# Patient Record
Sex: Male | Born: 1980 | Hispanic: Yes | Marital: Single | Smoking: Never smoker
Health system: Southern US, Community
[De-identification: ages and names within clinical notes are randomized; demographics above are authoritative.]

## PROBLEM LIST (undated history)

## (undated) DIAGNOSIS — I639 Cerebral infarction, unspecified: Secondary | ICD-10-CM

---

## 2016-03-29 DIAGNOSIS — Z0000 Encounter for general adult medical examination without abnormal findings: Secondary | ICD-10-CM | POA: Diagnosis not present

## 2016-03-29 DIAGNOSIS — Z6834 Body mass index (BMI) 34.0-34.9, adult: Secondary | ICD-10-CM | POA: Diagnosis not present

## 2016-08-24 DIAGNOSIS — M10071 Idiopathic gout, right ankle and foot: Secondary | ICD-10-CM | POA: Diagnosis not present

## 2016-10-05 DIAGNOSIS — R1084 Generalized abdominal pain: Secondary | ICD-10-CM | POA: Diagnosis not present

## 2016-10-05 DIAGNOSIS — R300 Dysuria: Secondary | ICD-10-CM | POA: Diagnosis not present

## 2016-10-05 DIAGNOSIS — R1030 Lower abdominal pain, unspecified: Secondary | ICD-10-CM | POA: Diagnosis not present

## 2016-10-05 DIAGNOSIS — R110 Nausea: Secondary | ICD-10-CM | POA: Diagnosis not present

## 2016-10-05 DIAGNOSIS — R109 Unspecified abdominal pain: Secondary | ICD-10-CM | POA: Diagnosis not present

## 2016-10-05 DIAGNOSIS — G8929 Other chronic pain: Secondary | ICD-10-CM | POA: Diagnosis not present

## 2016-10-05 DIAGNOSIS — R935 Abnormal findings on diagnostic imaging of other abdominal regions, including retroperitoneum: Secondary | ICD-10-CM | POA: Diagnosis not present

## 2016-12-15 DIAGNOSIS — R300 Dysuria: Secondary | ICD-10-CM | POA: Diagnosis not present

## 2016-12-15 DIAGNOSIS — R3911 Hesitancy of micturition: Secondary | ICD-10-CM | POA: Diagnosis not present

## 2016-12-15 DIAGNOSIS — Z7251 High risk heterosexual behavior: Secondary | ICD-10-CM | POA: Diagnosis not present

## 2017-05-24 ENCOUNTER — Encounter: Payer: Self-pay | Admitting: Family Medicine

## 2017-05-24 ENCOUNTER — Ambulatory Visit (INDEPENDENT_AMBULATORY_CARE_PROVIDER_SITE_OTHER): Payer: BLUE CROSS/BLUE SHIELD | Admitting: Family Medicine

## 2017-05-24 DIAGNOSIS — L03113 Cellulitis of right upper limb: Secondary | ICD-10-CM | POA: Diagnosis not present

## 2017-05-24 MED ORDER — CEPHALEXIN 500 MG PO CAPS: 500.0000 mg | ORAL_CAPSULE | Freq: Four times a day (QID) | ORAL | 0 refills | Status: DC

## 2017-05-24 MED ORDER — DICLOXACILLIN SODIUM 500 MG PO CAPS: 500.0000 mg | ORAL_CAPSULE | Freq: Four times a day (QID) | ORAL | 0 refills | Status: DC

## 2017-05-24 MED ORDER — HYDROCODONE-ACETAMINOPHEN 5-325 MG PO TABS: 1.0000 | ORAL_TABLET | Freq: Four times a day (QID) | ORAL | 0 refills | Status: DC | PRN

## 2017-05-24 NOTE — Patient Instructions (Addendum)
You have cellulitis. Take dicloxacillin four times a day. Norco as needed for severe pain (no driving, working on this medicine). Wear a sling for comfort. Follow up with me tomorrow afternoon in the [place] office to reevaluate.

## 2017-05-25 ENCOUNTER — Emergency Department (HOSPITAL_BASED_OUTPATIENT_CLINIC_OR_DEPARTMENT_OTHER)
Admission: EM | Admit: 2017-05-25 | Discharge: 2017-05-25 | Disposition: A | Discharge status: Home / Self Care | Payer: BLUE CROSS/BLUE SHIELD | Source: Home / Self Care | Attending: Emergency Medicine | Admitting: Emergency Medicine

## 2017-05-25 ENCOUNTER — Encounter (HOSPITAL_BASED_OUTPATIENT_CLINIC_OR_DEPARTMENT_OTHER): Payer: Self-pay | Admitting: Emergency Medicine

## 2017-05-25 ENCOUNTER — Encounter: Payer: Self-pay | Admitting: Family Medicine

## 2017-05-25 ENCOUNTER — Ambulatory Visit (INDEPENDENT_AMBULATORY_CARE_PROVIDER_SITE_OTHER): Payer: BLUE CROSS/BLUE SHIELD | Admitting: Family Medicine

## 2017-05-25 ENCOUNTER — Emergency Department (HOSPITAL_BASED_OUTPATIENT_CLINIC_OR_DEPARTMENT_OTHER): Payer: BLUE CROSS/BLUE SHIELD

## 2017-05-25 DIAGNOSIS — M7989 Other specified soft tissue disorders: Secondary | ICD-10-CM | POA: Diagnosis not present

## 2017-05-25 DIAGNOSIS — L03113 Cellulitis of right upper limb: Secondary | ICD-10-CM

## 2017-05-25 DIAGNOSIS — Z79899 Other long term (current) drug therapy: Secondary | ICD-10-CM

## 2017-05-25 DIAGNOSIS — Z23 Encounter for immunization: Secondary | ICD-10-CM | POA: Diagnosis not present

## 2017-05-25 DIAGNOSIS — S59901A Unspecified injury of right elbow, initial encounter: Secondary | ICD-10-CM | POA: Diagnosis not present

## 2017-05-25 IMAGING — DX DG ELBOW COMPLETE 3+V*R*
4 series · 4 of 4 positions shown · non-contrast
Comparison: None.

CLINICAL DATA: Acute onset of right elbow pain, swelling and
erythema.

EXAM:
RIGHT ELBOW - COMPLETE 3+ VIEW

[elbow ap]
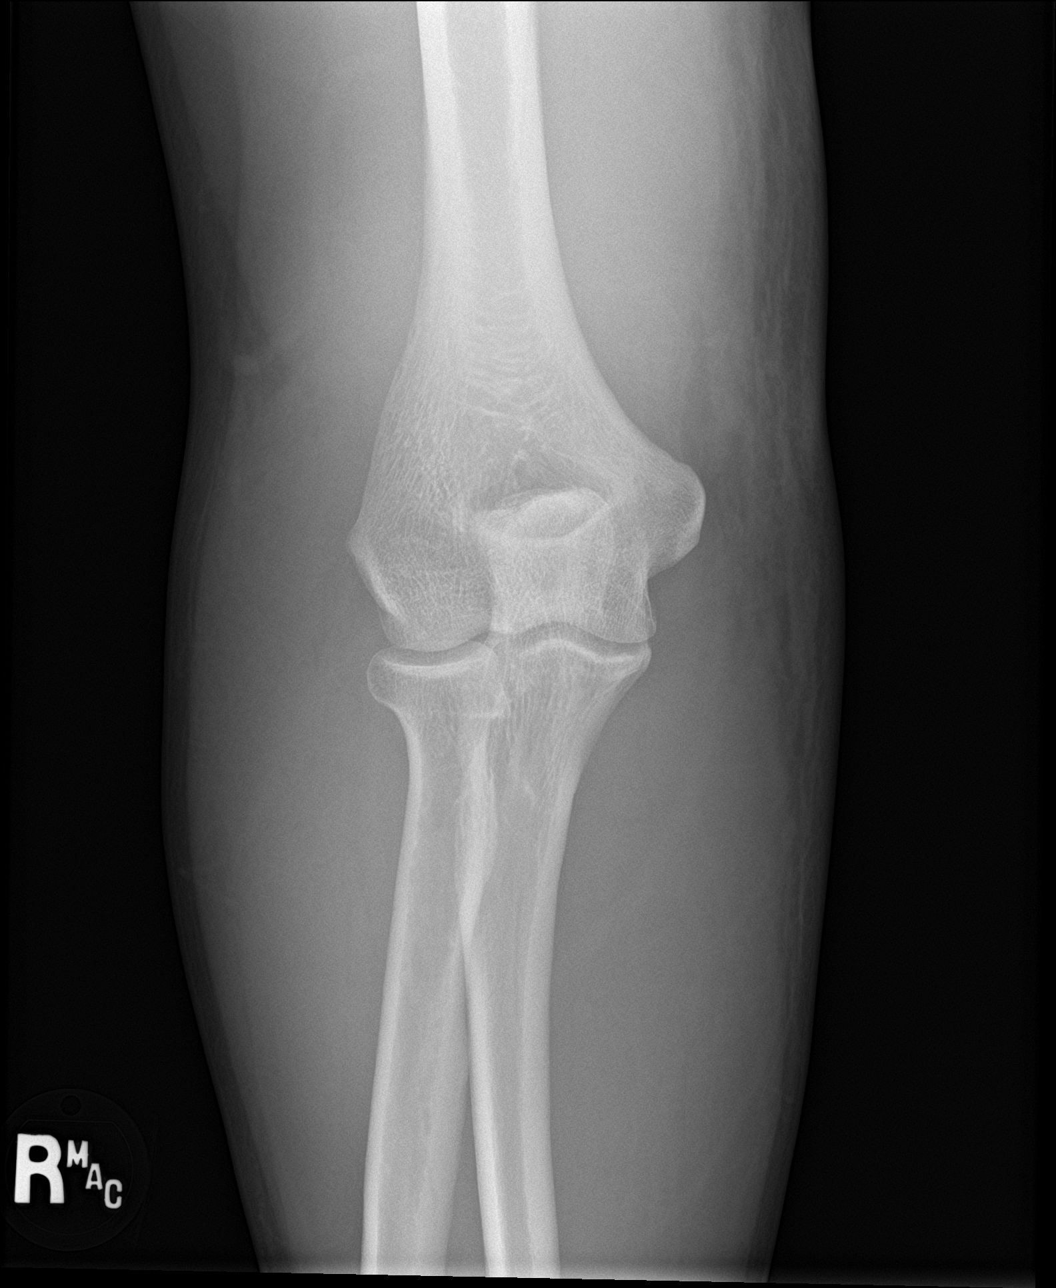

[elbow obl (1 of 2)]
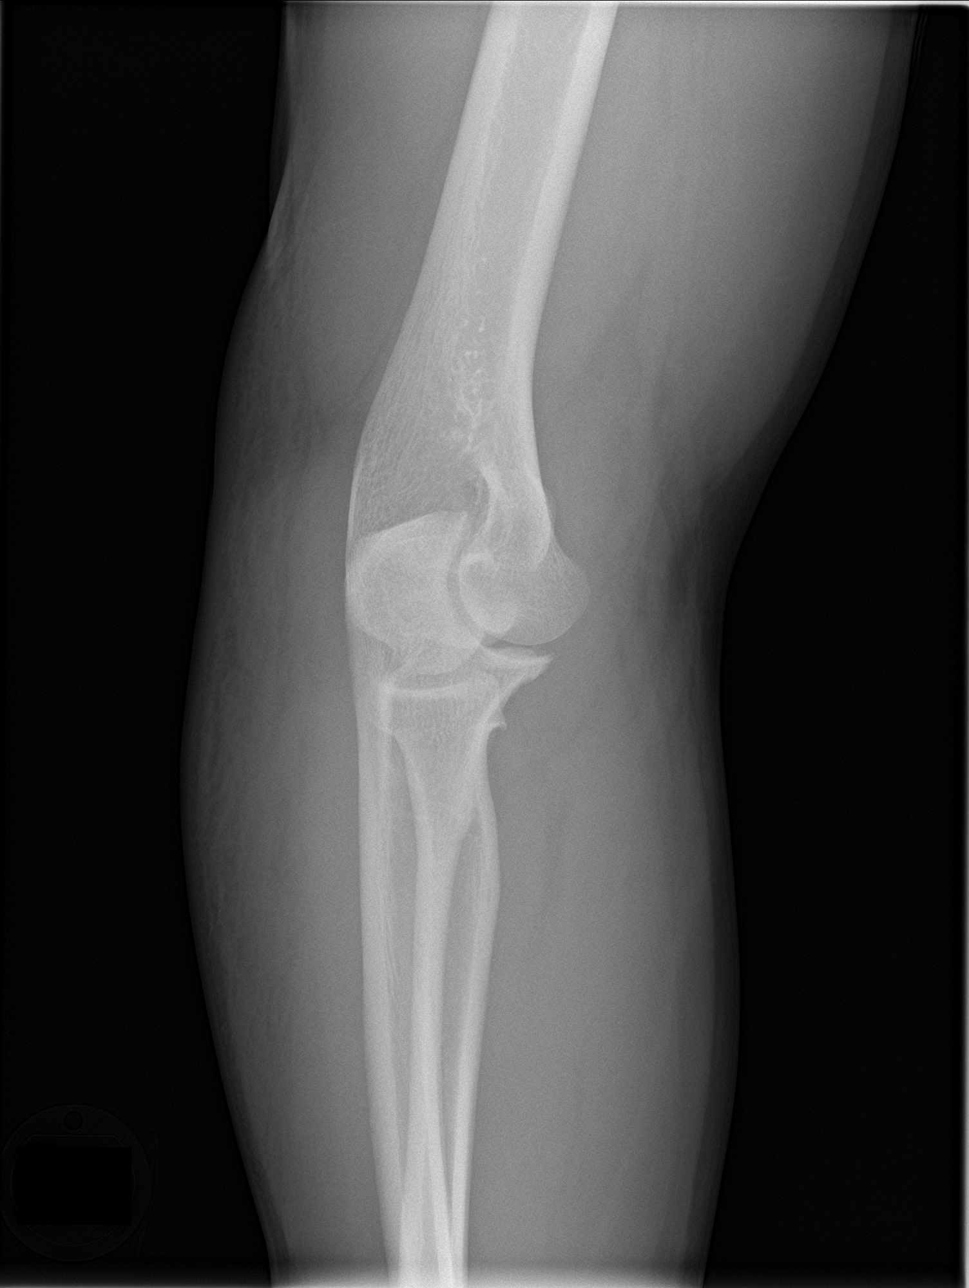

[elbow obl (2 of 2)]
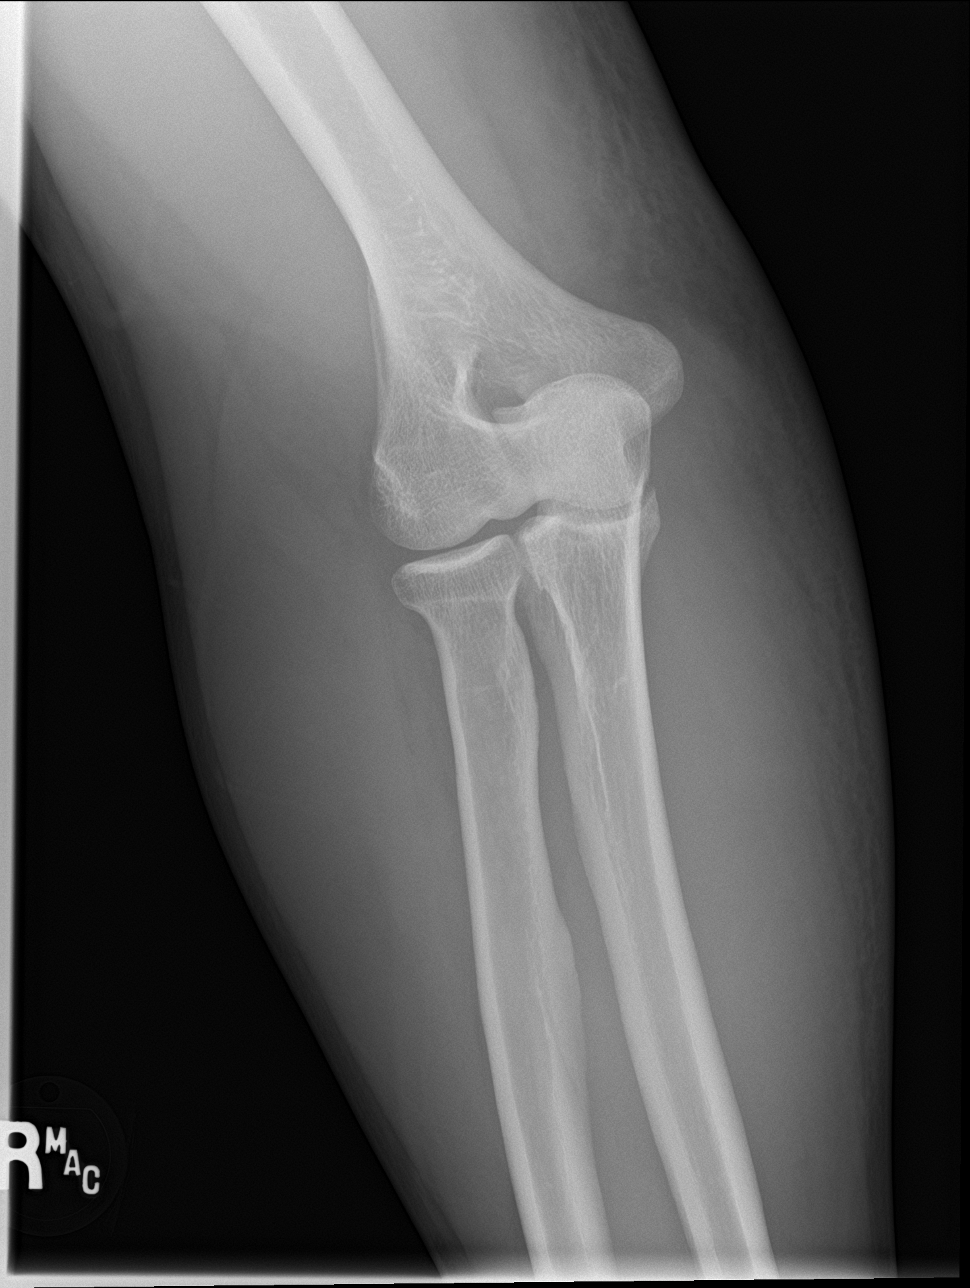

[elbow lat]
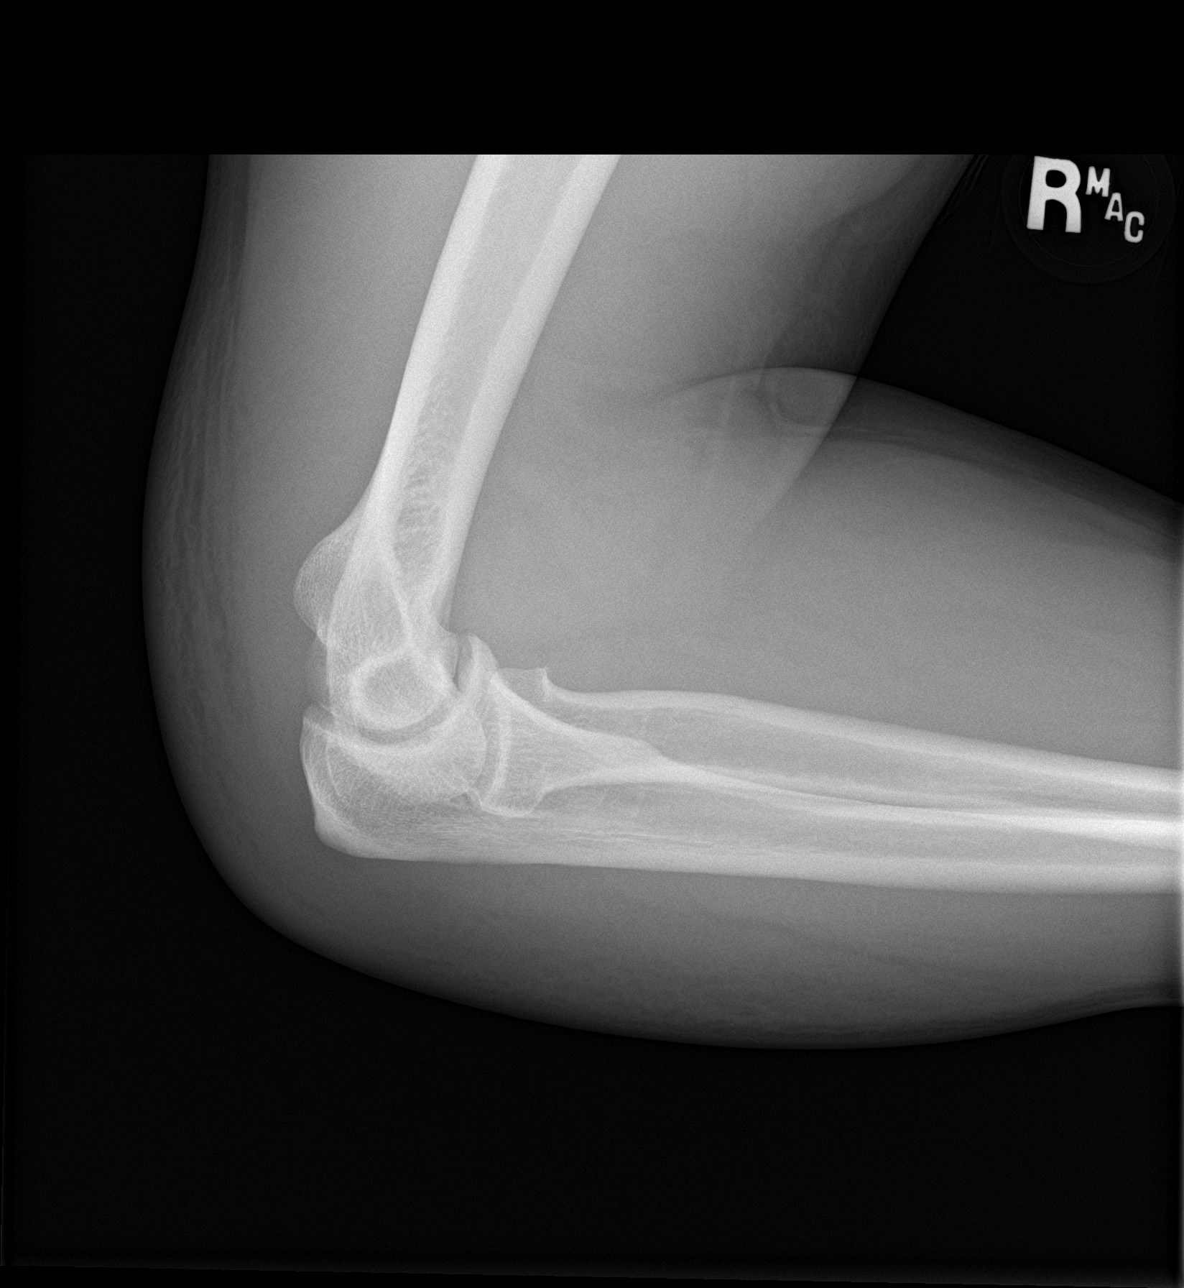

[4 of 4 positions shown; findings below may reference images not displayed]

FINDINGS: There is no evidence of fracture or dislocation. The visualized
joint spaces are preserved. No significant joint effusion is
identified. Diffuse soft tissue swelling is noted about the elbow.
No radiopaque foreign bodies are seen.
IMPRESSION: No evidence of fracture or dislocation. Diffuse soft tissue swelling
noted.

## 2017-05-25 MED ORDER — IBUPROFEN 400 MG PO TABS: 400.0000 mg | ORAL_TABLET | Freq: Once | ORAL | Status: DC

## 2017-05-25 MED ORDER — KETOROLAC TROMETHAMINE 60 MG/2ML IM SOLN
30.0000 mg | Freq: Once | INTRAMUSCULAR | Status: AC
  Administered 2017-05-25: 30 mg via INTRAMUSCULAR

## 2017-05-25 MED ORDER — DOXYCYCLINE HYCLATE 100 MG PO TABS
100.0000 mg | ORAL_TABLET | Freq: Once | ORAL | Status: AC
  Administered 2017-05-25: 100 mg via ORAL

## 2017-05-25 MED ORDER — DOXYCYCLINE HYCLATE 100 MG PO CAPS: 100.0000 mg | ORAL_CAPSULE | Freq: Two times a day (BID) | ORAL | 0 refills | Status: DC

## 2017-05-25 MED ORDER — KETOROLAC TROMETHAMINE 30 MG/ML IJ SOLN
INTRAMUSCULAR | Status: AC
  Filled 2017-05-25: qty 1

## 2017-05-25 MED ORDER — DOXYCYCLINE HYCLATE 100 MG PO TABS
ORAL_TABLET | ORAL | Status: AC
  Filled 2017-05-25: qty 1

## 2017-05-25 MED ORDER — IBUPROFEN 800 MG PO TABS: 800.0000 mg | ORAL_TABLET | Freq: Three times a day (TID) | ORAL | 0 refills | Status: DC

## 2017-05-25 NOTE — Assessment & Plan Note (Signed)
no evidence abscess or effusion.  Afebrile.  Will start keflex with norco as needed for severe pain.  Follow up in 24 hours for reevaluation.  Sling for comfort.

## 2017-05-25 NOTE — ED Provider Notes (Signed)
[facility] EMERGENCY DEPARTMENT Provider Note   CSN: [id number] Arrival date & time: 05/25/17  0449     History   Chief Complaint Chief Complaint  Patient presents with  . Arm Pain    HPI Steve Wolfe is a 37 y.o. male.  The history is provided by the patient.  Arm Pain  This is a new problem. The current episode started more than 1 week ago. The problem occurs constantly. The problem has been gradually worsening. Pertinent negatives include no chest pain, no abdominal pain, no headaches and no shortness of breath. Nothing aggravates the symptoms. Nothing relieves the symptoms. Treatments tried: keflex and norco. The treatment provided no relief.  Seen by Dr. Norton Blizzard yesterday and started on antibiotics and pain medication.  Had US done by Dr. Pearletha Forge without abscess per report.  Patient scheduled to see Dr. Pearletha Forge today for recheck but unable to tolerate pain on current regimen.  Hit it on the counter 3 weeks ago.  Tetanus is up to date.    History reviewed. No pertinent past medical history.  There are no active problems to display for this patient.   History reviewed. No pertinent surgical history.     Home Medications    Prior to Admission medications   Medication Sig Start Date End Date Taking? Authorizing Provider  cephALEXin (KEFLEX) 500 MG capsule Take 1 capsule (500 mg total) by mouth 4 (four) times daily. 05/24/17  Yes Hudnall, Azucena Fallen, MD  HYDROcodone-acetaminophen (NORCO) 5-325 MG tablet Take 1 tablet by mouth every 6 (six) hours as needed for moderate pain. 05/24/17  Yes Hudnall, Azucena Fallen, MD  ibuprofen (ADVIL,MOTRIN) 800 MG tablet Take 800 mg by mouth. 10/05/16  Yes [provider]    Family History No family history on file.  Social History Social History   Tobacco Use  . Smoking status: Never Smoker  . Smokeless tobacco: Never Used  Substance Use Topics  . Alcohol use: Not on file  . Drug use: Not on file     Allergies     Patient has no known allergies.   Review of Systems Review of Systems  Constitutional: Negative for appetite change and fever.  Respiratory: Negative for shortness of breath.   Cardiovascular: Negative for chest pain.  Gastrointestinal: Negative for abdominal pain and vomiting.  Musculoskeletal: Positive for arthralgias.  Neurological: Negative for headaches.  All other systems reviewed and are negative.    Physical Exam Updated Vital Signs BP 137/84 (BP Location: Left Arm)   Pulse 98   Temp 99.9 F (37.7 C) (Oral)   Resp 18   Ht 6' (1.829 m)   Wt 114.8 kg (253 lb)   SpO2 98%   BMI 34.31 kg/m   Physical Exam  Constitutional: He is oriented to person, place, and time. He appears well-developed and well-nourished. No distress.  HENT:  Head: Normocephalic and atraumatic.  Mouth/Throat: No oropharyngeal exudate.  Eyes: Conjunctivae are normal. Pupils are equal, round, and reactive to light.  Neck: Normal range of motion. Neck supple.  Cardiovascular: Normal rate, regular rhythm, normal heart sounds and intact distal pulses.  Pulmonary/Chest: Effort normal and breath sounds normal. No stridor. He has no wheezes. He has no rales.  Abdominal: Soft. Bowel sounds are normal. There is no tenderness. There is no rebound and no guarding.  Musculoskeletal: He exhibits edema.       Right elbow: He exhibits swelling. He exhibits no effusion, no deformity and no laceration. No tenderness found.  No radial head, no medial epicondyle, no lateral epicondyle and no olecranon process tenderness noted.       Arms: Neurological: He is alert and oriented to person, place, and time. He displays normal reflexes.  Skin: Skin is warm. Capillary refill takes less than 2 seconds.  Psychiatric: He has a normal mood and affect.     ED Treatments / Results   Radiology Dg Elbow Complete Right  Result Date: 05/25/2017 CLINICAL DATA:  Acute onset of right elbow pain, swelling and erythema. EXAM:  RIGHT ELBOW - COMPLETE 3+ VIEW COMPARISON:  None. FINDINGS: There is no evidence of fracture or dislocation. The visualized joint spaces are preserved. No significant joint effusion is identified. Diffuse soft tissue swelling is noted about the elbow. No radiopaque foreign bodies are seen. IMPRESSION: No evidence of fracture or dislocation. Diffuse soft tissue swelling noted. Electronically Signed   By: Roanna RaiderJeffery  Chang M.D.   On: 05/25/2017 05:47    Procedures Procedures (including critical care time)  Medications Ordered in ED Medications  ketorolac (TORADOL) injection 30 mg (30 mg Intramuscular Given 05/25/17 0518)  doxycycline (VIBRA-TABS) tablet 100 mg (100 mg Oral Given 05/25/17 0518)       Final Clinical Impressions(s) / ED Diagnoses   Follow up with Dr. Pearletha ForgeHudnall as scheduled.  Will add doxycycline and Ibuprofen.    Return for weakness, numbness, changes in vision or speech,  fevers > 100.4 unrelieved by medication, shortness of breath, intractable vomiting, or diarrhea, abdominal pain, Inability to tolerate liquids or food, cough, altered mental status or any concerns. No signs of systemic illness or infection. The patient is nontoxic-appearing on exam and vital signs are within normal limits.    I have reviewed the triage vital signs and the nursing notes. Pertinent labs &imaging results that were available during my care of the patient were reviewed by me and considered in my medical decision making (see chart for details).  After history, exam, and medical workup I feel the patient has been appropriately medically screened and is safe for discharge home. Pertinent diagnoses were discussed with the patient. Patient was given return precautions.   Jameya Pontiff, MD 05/25/17 [phone]

## 2017-05-25 NOTE — Assessment & Plan Note (Signed)
Patient feels better on the keflex and doxycycline now about 24 hours out since starting antibiotics.  Denies fever - was 99.9 in ED.  However, swelling is worse.  Marked outline with pen.  Advised if he feels worse, increased erythema/swelling to wrist or above mid-bicep, fever >100.4 despite abx, other concerns, to go to ED.  Continue keflex and doxy.  Norco if needed for pain.  Follow up tomorrow morning.  We discussed not unusual for appearance to be worse with antibiotic treatment but symptoms to be improved as bacteria causing cellulitis is treated.  Reports he no longer needs sling.

## 2017-05-25 NOTE — ED Notes (Signed)
Patient transported to X-ray 

## 2017-05-25 NOTE — Progress Notes (Signed)
PCP: Patient, No Pcp Per  Subjective:   HPI: Patient is a 37 y.o. male here for right elbow pain, swelling.  2/6: Patient reports about 3 weeks ago he recalls scraping his right elbow on the kitchen counter. He was sick around this time with some congestion, fever, had to take nyquil and improved. Elbow area crusted over. Then about 1 day ago he noticed right elbow was swelling, becoming more red, difficulty with extending this. Pain level 7/10 and sharp with extension of elbow posteriorly. No other skin changes, numbness.  2/7: Patient returns today for reevaluation of his elbow. He is taking his keflex without a problem. Went to ED overnight because of pain - was given shot of toradol, had radiographs that were normal except for soft tissue swelling (no effusion), doxycycline was added. He reports he feels much better though arm is more swollen. Pain level down to 5/10, can straighten elbow better. No other skin changes, numbness. Denies fevers.  History reviewed. No pertinent past medical history.  Current Outpatient Medications on File Prior to Visit  Medication Sig Dispense Refill  . cephALEXin (KEFLEX) 500 MG capsule Take 1 capsule (500 mg total) by mouth 4 (four) times daily. 28 capsule 0  . HYDROcodone-acetaminophen (NORCO) 5-325 MG tablet Take 1 tablet by mouth every 6 (six) hours as needed for moderate pain. 20 tablet 0  . ibuprofen (ADVIL,MOTRIN) 800 MG tablet Take 800 mg by mouth.     No current facility-administered medications on file prior to visit.     History reviewed. No pertinent surgical history.  No Known Allergies  Social History   Socioeconomic History  . Marital status: Single    Spouse name: Not on file  . Number of children: Not on file  . Years of education: Not on file  . Highest education level: Not on file  Social Needs  . Financial resource strain: Not on file  . Food insecurity - worry: Not on file  . Food insecurity - inability: Not  on file  . Transportation needs - medical: Not on file  . Transportation needs - non-medical: Not on file  Occupational History  . Not on file  Tobacco Use  . Smoking status: Never Smoker  . Smokeless tobacco: Never Used  Substance and Sexual Activity  . Alcohol use: Not on file  . Drug use: Not on file  . Sexual activity: Not on file  Other Topics Concern  . Not on file  Social History Narrative  . Not on file    History reviewed. No pertinent family history.  BP 117/83   Pulse (!) 112   Ht 6' (1.829 m)   Wt 253 lb (114.8 kg)   BMI 34.31 kg/m   Review of Systems: See HPI above.     Objective:  Physical Exam:  Gen: NAD, comfortable in exam room.  Right elbow: Worsened soft tissue swelling posterior elbow just over halfway into forearm.  Mild associated erythema that is more proximal in elbow.  No palpable mass.  No bruising or other deformity. Tenderness is minimal now over swollen area, improved. FROM with mild pain on extension.  5/5 strength flexion/extension. NVI distally.  MSK u/s:  Confirms still no presence of effusion or abscess.  No abnormalities of muscles deep to swollen area.  Radius and ulna, distal humerus appear normal.   Assessment & Plan:  1. Cellulitis of right elbow - Patient feels better on the keflex and doxycycline now about 24 hours out since starting  antibiotics.  Denies fever - was 99.9 in ED.  However, swelling is worse.  Marked outline with pen.  Advised if he feels worse, increased erythema/swelling to wrist or above mid-bicep, fever >100.4 despite abx, other concerns, to go to ED.  Continue keflex and doxy.  Norco if needed for pain.  Follow up tomorrow morning.  We discussed not unusual for appearance to be worse with antibiotic treatment but symptoms to be improved as bacteria causing cellulitis is treated.  Reports he no longer needs sling.

## 2017-05-25 NOTE — ED Triage Notes (Addendum)
Pain and swelling to right elbow. Scheduled to see Dr Marja KaysHudall today at 1600 . CMS intact to right hand

## 2017-05-25 NOTE — Progress Notes (Signed)
PCP: Patient, No Pcp Per  Subjective:   HPI: Patient is a 37 y.o. male here for right elbow pain, swelling.  Patient reports about 3 weeks ago he recalls scraping his right elbow on the kitchen counter. He was sick around this time with some congestion, fever, had to take nyquil and improved. Elbow area crusted over. Then about 1 day ago he noticed right elbow was swelling, becoming more red, difficulty with extending this. Pain level 7/10 and sharp with extension of elbow posteriorly. No other skin changes, numbness.  History reviewed. No pertinent past medical history.  Current Outpatient Medications on File Prior to Visit  Medication Sig Dispense Refill  . ibuprofen (ADVIL,MOTRIN) 800 MG tablet Take 800 mg by mouth.     No current facility-administered medications on file prior to visit.     History reviewed. No pertinent surgical history.  No Known Allergies  Social History   Socioeconomic History  . Marital status: Single    Spouse name: Not on file  . Number of children: Not on file  . Years of education: Not on file  . Highest education level: Not on file  Social Needs  . Financial resource strain: Not on file  . Food insecurity - worry: Not on file  . Food insecurity - inability: Not on file  . Transportation needs - medical: Not on file  . Transportation needs - non-medical: Not on file  Occupational History  . Not on file  Tobacco Use  . Smoking status: Never Smoker  . Smokeless tobacco: Never Used  Substance and Sexual Activity  . Alcohol use: Not on file  . Drug use: Not on file  . Sexual activity: Not on file  Other Topics Concern  . Not on file  Social History Narrative  . Not on file    History reviewed. No pertinent family history.  BP 127/86   Pulse 90   Temp 99.5 F (37.5 C) (Oral)   Ht 6' (1.829 m)   Wt 253 lb (114.8 kg)   BMI 34.31 kg/m   Review of Systems: See HPI above.     Objective:  Physical Exam:  Gen: NAD, comfortable  in exam room  Right elbow: Mod soft tissue swelling of posterior elbow into forearm with erythema.  No palpable mass.  No bruising, other deformity. TTP over swollen area.   FROM but pain on extension.  5/5 strength extension/flexion. Collateral ligaments intact. NVI distally.  MSK u/s:  No elbow effusion.  No abscess noted.  Significant soft tissue swelling however.   Assessment & Plan:  1. Cellulitis of right elbow - no evidence abscess or effusion.  Afebrile.  Will start keflex with norco as needed for severe pain.  Follow up in 24 hours for reevaluation.  Sling for comfort.

## 2017-05-26 ENCOUNTER — Ambulatory Visit (INDEPENDENT_AMBULATORY_CARE_PROVIDER_SITE_OTHER): Payer: BLUE CROSS/BLUE SHIELD | Admitting: Family Medicine

## 2017-05-26 ENCOUNTER — Encounter: Payer: Self-pay | Admitting: Family Medicine

## 2017-05-26 ENCOUNTER — Inpatient Hospital Stay (HOSPITAL_BASED_OUTPATIENT_CLINIC_OR_DEPARTMENT_OTHER)
Admission: EM | Admit: 2017-05-26 | Discharge: 2017-05-28 | DRG: 603 | Disposition: A | Discharge status: Home / Self Care | Payer: BLUE CROSS/BLUE SHIELD | Attending: Internal Medicine | Admitting: Internal Medicine

## 2017-05-26 ENCOUNTER — Other Ambulatory Visit: Payer: Self-pay

## 2017-05-26 ENCOUNTER — Encounter (HOSPITAL_BASED_OUTPATIENT_CLINIC_OR_DEPARTMENT_OTHER): Payer: Self-pay | Admitting: Emergency Medicine

## 2017-05-26 DIAGNOSIS — L03113 Cellulitis of right upper limb: Secondary | ICD-10-CM | POA: Diagnosis not present

## 2017-05-26 DIAGNOSIS — Z79899 Other long term (current) drug therapy: Secondary | ICD-10-CM | POA: Diagnosis not present

## 2017-05-26 DIAGNOSIS — Z23 Encounter for immunization: Secondary | ICD-10-CM | POA: Diagnosis not present

## 2017-05-26 DIAGNOSIS — L0390 Cellulitis, unspecified: Secondary | ICD-10-CM | POA: Diagnosis present

## 2017-05-26 LAB — URINALYSIS, ROUTINE W REFLEX MICROSCOPIC
Bilirubin Urine: NEGATIVE
GLUCOSE, UA: NEGATIVE mg/dL
Ketones, ur: NEGATIVE mg/dL
Leukocytes, UA: NEGATIVE
Nitrite: NEGATIVE
PROTEIN: NEGATIVE mg/dL
Specific Gravity, Urine: 1.02 (ref 1.005–1.030)
pH: 6 (ref 5.0–8.0)

## 2017-05-26 LAB — URINALYSIS, MICROSCOPIC (REFLEX): WBC, UA: NONE SEEN WBC/hpf (ref 0–5)

## 2017-05-26 LAB — COMPREHENSIVE METABOLIC PANEL
ALBUMIN: 3.8 g/dL (ref 3.5–5.0)
ALT: 39 U/L (ref 17–63)
AST: 34 U/L (ref 15–41)
Alkaline Phosphatase: 66 U/L (ref 38–126)
Anion gap: 8 (ref 5–15)
BUN: 16 mg/dL (ref 6–20)
CHLORIDE: 105 mmol/L (ref 101–111)
CO2: 24 mmol/L (ref 22–32)
CREATININE: 1.35 mg/dL — AB (ref 0.61–1.24)
Calcium: 8.8 mg/dL — ABNORMAL LOW (ref 8.9–10.3)
GFR calc Af Amer: >60 mL/min (ref >60)
GLUCOSE: 137 mg/dL — AB (ref 65–99)
Potassium: 3.9 mmol/L (ref 3.5–5.1)
SODIUM: 137 mmol/L (ref 135–145)
Total Bilirubin: 0.5 mg/dL (ref 0.3–1.2)
Total Protein: 7.5 g/dL (ref 6.5–8.1)

## 2017-05-26 LAB — CBC WITH DIFFERENTIAL/PLATELET
Basophils Absolute: 0 10*3/uL (ref 0.0–0.1)
Basophils Relative: 0 %
EOS ABS: 0.1 10*3/uL (ref 0.0–0.7)
EOS PCT: 1 %
HCT: 40 % (ref 39.0–52.0)
Hemoglobin: 13.6 g/dL (ref 13.0–17.0)
LYMPHS ABS: 1 10*3/uL (ref 0.7–4.0)
Lymphocytes Relative: 10 %
MCH: 29.9 pg (ref 26.0–34.0)
MCHC: 34 g/dL (ref 30.0–36.0)
MCV: 87.9 fL (ref 78.0–100.0)
MONO ABS: 0.6 10*3/uL (ref 0.1–1.0)
Monocytes Relative: 6 %
Neutro Abs: 7.9 10*3/uL — ABNORMAL HIGH (ref 1.7–7.7)
Neutrophils Relative %: 83 %
Platelets: 200 10*3/uL (ref 150–400)
RBC: 4.55 MIL/uL (ref 4.22–5.81)
RDW: 14.2 % (ref 11.5–15.5)
WBC: 9.7 10*3/uL (ref 4.0–10.5)

## 2017-05-26 LAB — I-STAT CG4 LACTIC ACID, ED: LACTIC ACID, VENOUS: 1.1 mmol/L (ref 0.5–1.9)

## 2017-05-26 MED ORDER — ACETAMINOPHEN 325 MG PO TABS
650.0000 mg | ORAL_TABLET | Freq: Once | ORAL | Status: AC | PRN
  Administered 2017-05-26: 650 mg via ORAL
  Filled 2017-05-26: qty 2

## 2017-05-26 MED ORDER — VANCOMYCIN HCL IN DEXTROSE 750-5 MG/150ML-% IV SOLN
750.0000 mg | Freq: Two times a day (BID) | INTRAVENOUS | Status: DC
  Administered 2017-05-27 – 2017-05-28 (×3): 750 mg via INTRAVENOUS
  Filled 2017-05-26 (×5): qty 150

## 2017-05-26 MED ORDER — VANCOMYCIN HCL 500 MG IV SOLR
INTRAVENOUS | Status: AC
  Filled 2017-05-26: qty 2000

## 2017-05-26 MED ORDER — VANCOMYCIN HCL 10 G IV SOLR
2000.0000 mg | Freq: Once | INTRAVENOUS | Status: AC
  Administered 2017-05-26: 2000 mg via INTRAVENOUS
  Filled 2017-05-26: qty 2000

## 2017-05-26 MED ORDER — SODIUM CHLORIDE 0.9 % IV BOLUS (SEPSIS)
1000.0000 mL | Freq: Once | INTRAVENOUS | Status: AC
  Administered 2017-05-26: 1000 mL via INTRAVENOUS

## 2017-05-26 NOTE — Patient Instructions (Signed)
Follow up with me next Tuesday for a no-charge visit. Finish the antibiotics even though you're improving. If you have any questions give me a call. If you develop a fever > 100.4, pain worsens, dizziness, go to the ER (this would be extremely unusual at this point).

## 2017-05-26 NOTE — ED Provider Notes (Signed)
[facility] EMERGENCY DEPARTMENT Provider Note   CSN: [id number] Arrival date & time: 05/26/17  2008     History   Chief Complaint Chief Complaint  Patient presents with  . Fever  . Cellulitis    HPI Steve Wolfe is a 37 y.o. male.  HPI Patient presents with fever and chills.  Over the last few days he has had a cellulitis of his left elbow and forearm.'s been seen both in the urgent care and twice at sports medicine, Dr. Pearletha ForgeHudnall.  Had been seen earlier today in the office and had been doing well.  States his swelling is gone down.  Now at around 4:00 today began to have fevers.  States he has had chills.  States he feels lightheaded.  States the redness that was previously demarcated by a pen mark has now extended past the borders.  No cough.  No shortness of breath.  No nausea vomiting diarrhea.  He had had an ultrasound done at sports medicine did not show an abscess.  Had been on Keflex and yesterday started on doxycycline. History reviewed. No pertinent past medical history.  Patient Active Problem List   Diagnosis Date Noted  . Cellulitis of right elbow 05/25/2017    History reviewed. No pertinent surgical history.     Home Medications    Prior to Admission medications   Medication Sig Start Date End Date Taking? Authorizing Provider  cephALEXin (KEFLEX) 500 MG capsule Take 1 capsule (500 mg total) by mouth 4 (four) times daily. 05/24/17   Hudnall, Azucena FallenShane R, MD  doxycycline (VIBRAMYCIN) 100 MG capsule Take 1 capsule (100 mg total) by mouth 2 (two) times daily. One po bid x 7 days 05/25/17   Nicanor AlconPalumbo, April, MD  HYDROcodone-acetaminophen (NORCO) 5-325 MG tablet Take 1 tablet by mouth every 6 (six) hours as needed for moderate pain. 05/24/17   Lenda KelpHudnall, Shane R, MD  ibuprofen (ADVIL,MOTRIN) 800 MG tablet Take 800 mg by mouth. 10/05/16   [provider]  ibuprofen (ADVIL,MOTRIN) 800 MG tablet Take 1 tablet (800 mg total) by mouth 3 (three) times daily. 05/25/17    Palumbo, April, MD    Family History History reviewed. No pertinent family history.  Social History Social History   Tobacco Use  . Smoking status: Never Smoker  . Smokeless tobacco: Never Used  Substance Use Topics  . Alcohol use: Not on file  . Drug use: Not on file     Allergies   Patient has no known allergies.   Review of Systems Review of Systems  Constitutional: Positive for fatigue and fever. Negative for appetite change.  HENT: Negative for congestion.   Respiratory: Negative for shortness of breath.   Cardiovascular: Negative for chest pain.  Gastrointestinal: Negative for abdominal pain.  Genitourinary: Negative for flank pain.  Musculoskeletal: Negative for back pain.  Skin: Positive for wound.  Neurological: Positive for light-headedness.  Psychiatric/Behavioral: Negative for confusion.     Physical Exam Updated Vital Signs BP (!) 146/83   Pulse (!) 114   Temp (!) 100.5 F (38.1 C) (Oral)   Resp 20   Ht 6' (1.829 m)   Wt 114.8 kg (253 lb)   SpO2 99%   BMI 34.31 kg/m   Physical Exam  Constitutional: He appears well-developed and well-nourished.  HENT:  Head: Atraumatic.  Eyes: Pupils are equal, round, and reactive to light.  Neck: Neck supple.  Cardiovascular:  Mild tachycardia  Pulmonary/Chest: Effort normal.  Abdominal: There is no tenderness.  Musculoskeletal:  Erythema with some swelling of right forearm and elbow.  Good range of motion elbow.  There is a pen demarcating previous erythema and the erythema has moved a few centimeters past this demarcation.  Skin: Skin is warm. Capillary refill takes less than 2 seconds.       ED Treatments / Results  Labs (all labs ordered are listed, but only abnormal results are displayed) Labs Reviewed  COMPREHENSIVE METABOLIC PANEL - Abnormal; Notable for the following components:      Result Value   Glucose, Bld 137 (*)    Creatinine, Ser 1.35 (*)    Calcium 8.8 (*)    All other  components within normal limits  CBC WITH DIFFERENTIAL/PLATELET - Abnormal; Notable for the following components:   Neutro Abs 7.9 (*)    All other components within normal limits  URINALYSIS, ROUTINE W REFLEX MICROSCOPIC - Abnormal; Notable for the following components:   Hgb urine dipstick MODERATE (*)    All other components within normal limits  URINALYSIS, MICROSCOPIC (REFLEX) - Abnormal; Notable for the following components:   Bacteria, UA RARE (*)    Squamous Epithelial / LPF 0-5 (*)    All other components within normal limits  I-STAT CG4 LACTIC ACID, ED  I-STAT CG4 LACTIC ACID, ED    EKG  EKG Interpretation None       Radiology Dg Elbow Complete Right  Result Date: 05/25/2017 CLINICAL DATA:  Acute onset of right elbow pain, swelling and erythema. EXAM: RIGHT ELBOW - COMPLETE 3+ VIEW COMPARISON:  None. FINDINGS: There is no evidence of fracture or dislocation. The visualized joint spaces are preserved. No significant joint effusion is identified. Diffuse soft tissue swelling is noted about the elbow. No radiopaque foreign bodies are seen. IMPRESSION: No evidence of fracture or dislocation. Diffuse soft tissue swelling noted. Electronically Signed   By: Roanna Raider M.D.   On: 05/25/2017 05:47    Procedures Procedures (including critical care time)  Medications Ordered in ED Medications  vancomycin (VANCOCIN) 2,000 mg in sodium chloride 0.9 % 500 mL IVPB (2,000 mg Intravenous New Bag/Given 05/26/17 2125)  vancomycin (VANCOCIN) 500 MG powder (not administered)  vancomycin (VANCOCIN) IVPB 750 mg/150 ml premix (not administered)  acetaminophen (TYLENOL) tablet 650 mg (650 mg Oral Given 05/26/17 2040)  sodium chloride 0.9 % bolus 1,000 mL (1,000 mLs Intravenous New Bag/Given 05/26/17 2131)     Initial Impression / Assessment and Plan / ED Course  I have reviewed the triage vital signs and the nursing notes.  Pertinent labs & imaging results that were available during my care  of the patient were reviewed by me and considered in my medical decision making (see chart for details).     Patient with forearm cellulitis.  Had been on Keflex and doxycycline but now began to have fevers.  Fevers and tachycardia.  Will admit to hospitalist.  Lab work overall reassuring with normal lactic acid.  Final Clinical Impressions(s) / ED Diagnoses   Final diagnoses:  Right forearm cellulitis    ED Discharge Orders    None       Benjiman Core, MD 05/26/17 2259

## 2017-05-26 NOTE — Progress Notes (Signed)
Pharmacy Antibiotic Note  Steve Wolfe is a 37 y.o. male admitted on 05/26/2017 with cellulitis.  Pharmacy has been consulted for vancomycin dosing.  SCr 1.35, normalized CrCl 4175ml/min  Plan: Give vancomycin 2g IV x 1, then start vancomycin 750mg  IV Q12h Monitor clinical picture, renal function, VT prn F/U C&S, abx deescalation / LOT   Height: 6' (182.9 cm) Weight: 253 lb (114.8 kg) IBW/kg (Calculated) : 77.6  Temp (24hrs), Avg:100.5 F (38.1 C), Min:100.5 F (38.1 C), Max:100.5 F (38.1 C)  No results for input(s): WBC, CREATININE, LATICACIDVEN, VANCOTROUGH, VANCOPEAK, VANCORANDOM, GENTTROUGH, GENTPEAK, GENTRANDOM, TOBRATROUGH, TOBRAPEAK, TOBRARND, AMIKACINPEAK, AMIKACINTROU, AMIKACIN in the last 168 hours.  CrCl cannot be calculated (No order found.).    No Known Allergies  Thank you for allowing pharmacy to be a part of this patient's care.  Steve StammerBATCHELDER,Steve Wolfe 05/26/2017 9:01 PM

## 2017-05-26 NOTE — Progress Notes (Signed)
37 yo male with right arm cellulitis and fever, failed outpatient therapy and ED requesting admission

## 2017-05-26 NOTE — ED Triage Notes (Addendum)
Patient reports fever since 1600 today.  Reports generalized body aches.  Seen yesterday for cellulitis of right arm.  Currently taking abx for this-states erythema spreading.

## 2017-05-26 NOTE — Progress Notes (Addendum)
PCP: Patient, No Pcp Per  Subjective:   HPI: Patient is a 37 y.o. male here for right elbow pain, swelling.  2/6: Patient reports about 3 weeks ago he recalls scraping his right elbow on the kitchen counter. He was sick around this time with some congestion, fever, had to take nyquil and improved. Elbow area crusted over. Then about 1 day ago he noticed right elbow was swelling, becoming more red, difficulty with extending this. Pain level 7/10 and sharp with extension of elbow posteriorly. No other skin changes, numbness.  2/7: Patient returns today for reevaluation of his elbow. He is taking his keflex without a problem. Went to ED overnight because of pain - was given shot of toradol, had radiographs that were normal except for soft tissue swelling (no effusion), doxycycline was added. He reports he feels much better though arm is more swollen. Pain level down to 5/10, can straighten elbow better. No other skin changes, numbness. Denies fevers.  2/8: Patient returns today reporting he feels better. Still with swelling, redness but no fevers. Pain level continues to come down to 2/10 level. Tolerating doxycycline and keflex without side effects, taking as directed. Taking ibuprofen. Able to extend elbow easier. No other skin changes, numbness.  History reviewed. No pertinent past medical history.  No current facility-administered medications on file prior to visit.    Current Outpatient Medications on File Prior to Visit  Medication Sig Dispense Refill  . cephALEXin (KEFLEX) 500 MG capsule Take 1 capsule (500 mg total) by mouth 4 (four) times daily. 28 capsule 0  . doxycycline (VIBRAMYCIN) 100 MG capsule Take 1 capsule (100 mg total) by mouth 2 (two) times daily. One po bid x 7 days 14 capsule 0  . HYDROcodone-acetaminophen (NORCO) 5-325 MG tablet Take 1 tablet by mouth every 6 (six) hours as needed for moderate pain. 20 tablet 0  . ibuprofen (ADVIL,MOTRIN) 800 MG tablet  Take 800 mg by mouth.    Marland Kitchen. ibuprofen (ADVIL,MOTRIN) 800 MG tablet Take 1 tablet (800 mg total) by mouth 3 (three) times daily. 21 tablet 0    History reviewed. No pertinent surgical history.  No Known Allergies  Social History   Socioeconomic History  . Marital status: Single    Spouse name: Not on file  . Number of children: Not on file  . Years of education: Not on file  . Highest education level: Not on file  Social Needs  . Financial resource strain: Not on file  . Food insecurity - worry: Not on file  . Food insecurity - inability: Not on file  . Transportation needs - medical: Not on file  . Transportation needs - non-medical: Not on file  Occupational History  . Not on file  Tobacco Use  . Smoking status: Never Smoker  . Smokeless tobacco: Never Used  Substance and Sexual Activity  . Alcohol use: Not on file  . Drug use: Not on file  . Sexual activity: Not on file  Other Topics Concern  . Not on file  Social History Narrative  . Not on file    History reviewed. No pertinent family history.  BP 127/87   Pulse 97   Ht 6' (1.829 m)   Wt 253 lb (114.8 kg)   BMI 34.31 kg/m   Review of Systems: See HPI above.     Objective:  Physical Exam:  Gen: NAD, comfortable in exam room.  Right elbow: Soft tissue swelling and redness appear similar to yesterday within confines  of pen marking from posterior elbow to distal forearm. Area of swelling is softer, less tender. No palpable masses to suggest abscess. FROM with mild pain on extension. NVI distally.   Assessment & Plan:  1. Cellulitis of right elbow - Patient reports he's doing well.  Vitals reassuring here without fever - yesterday was tachycardic but normocardic today.  Swelling/erythema also appears to have stabilized compared to yesterday on doxycycline and keflex.  Continue these until completion even if he feels better.  He will follow up with [place] on Monday to reevaluate.  Advised if he gets fever  >100.4, worsening pain, redness, dizziness despite antibiotics to seek care in ED over the weekend for IV antibiotics.  Addendum:  Noticed his temperature had not been entered in vital signs - added this - was 98.78F.

## 2017-05-26 NOTE — Assessment & Plan Note (Signed)
Patient reports he's doing well.  Vitals reassuring here without fever - yesterday was tachycardic but normocardic today.  Swelling/erythema also appears to have stabilized compared to yesterday on doxycycline and keflex.  Continue these until completion even if he feels better.  He will follow up with us on Monday to reevaluate.  Advised if he gets fever >100.4, worsening pain, redness, dizziness despite antibiotics to seek care in ED over the weekend for IV antibiotics.

## 2017-05-27 LAB — BASIC METABOLIC PANEL
ANION GAP: 7 (ref 5–15)
BUN: 14 mg/dL (ref 6–20)
CO2: 22 mmol/L (ref 22–32)
Calcium: 8.5 mg/dL — ABNORMAL LOW (ref 8.9–10.3)
Chloride: 108 mmol/L (ref 101–111)
Creatinine, Ser: 1.07 mg/dL (ref 0.61–1.24)
GLUCOSE: 113 mg/dL — AB (ref 65–99)
POTASSIUM: 3.8 mmol/L (ref 3.5–5.1)
Sodium: 137 mmol/L (ref 135–145)

## 2017-05-27 LAB — CBC
HEMATOCRIT: 37.7 % — AB (ref 39.0–52.0)
Hemoglobin: 12.9 g/dL — ABNORMAL LOW (ref 13.0–17.0)
MCH: 29.7 pg (ref 26.0–34.0)
MCHC: 34.2 g/dL (ref 30.0–36.0)
MCV: 86.9 fL (ref 78.0–100.0)
PLATELETS: 178 10*3/uL (ref 150–400)
RBC: 4.34 MIL/uL (ref 4.22–5.81)
RDW: 14.1 % (ref 11.5–15.5)
WBC: 8.3 10*3/uL (ref 4.0–10.5)

## 2017-05-27 LAB — HIV ANTIBODY (ROUTINE TESTING W REFLEX): HIV SCREEN 4TH GENERATION: NONREACTIVE

## 2017-05-27 MED ORDER — ACETAMINOPHEN 650 MG RE SUPP: 650.0000 mg | Freq: Four times a day (QID) | RECTAL | Status: DC | PRN

## 2017-05-27 MED ORDER — ONDANSETRON HCL 4 MG/2ML IJ SOLN: 4.0000 mg | Freq: Four times a day (QID) | INTRAMUSCULAR | Status: DC | PRN

## 2017-05-27 MED ORDER — ONDANSETRON HCL 4 MG PO TABS: 4.0000 mg | ORAL_TABLET | Freq: Four times a day (QID) | ORAL | Status: DC | PRN

## 2017-05-27 MED ORDER — IBUPROFEN 200 MG PO TABS
600.0000 mg | ORAL_TABLET | Freq: Three times a day (TID) | ORAL | Status: DC
  Administered 2017-05-27 – 2017-05-28 (×4): 600 mg via ORAL
  Filled 2017-05-27 (×4): qty 3

## 2017-05-27 MED ORDER — INFLUENZA VAC SPLIT QUAD 0.5 ML IM SUSY
0.5000 mL | PREFILLED_SYRINGE | INTRAMUSCULAR | Status: AC
  Administered 2017-05-28: 0.5 mL via INTRAMUSCULAR
  Filled 2017-05-27: qty 0.5

## 2017-05-27 MED ORDER — ENOXAPARIN SODIUM 60 MG/0.6ML ~~LOC~~ SOLN
60.0000 mg | Freq: Every day | SUBCUTANEOUS | Status: DC
  Administered 2017-05-27: 60 mg via SUBCUTANEOUS
  Filled 2017-05-27 (×2): qty 0.6

## 2017-05-27 MED ORDER — HYDROCODONE-ACETAMINOPHEN 5-325 MG PO TABS
1.0000 | ORAL_TABLET | Freq: Four times a day (QID) | ORAL | Status: DC | PRN
Start: 2017-05-27 — End: 2017-05-28
  Administered 2017-05-27 (×2): 1 via ORAL
  Filled 2017-05-27 (×2): qty 1

## 2017-05-27 MED ORDER — ENOXAPARIN SODIUM 40 MG/0.4ML ~~LOC~~ SOLN: 40.0000 mg | SUBCUTANEOUS | Status: DC

## 2017-05-27 MED ORDER — ACETAMINOPHEN 325 MG PO TABS: 650.0000 mg | ORAL_TABLET | Freq: Four times a day (QID) | ORAL | Status: DC | PRN

## 2017-05-27 NOTE — ED Notes (Signed)
Pt transported by [facility] at this time.

## 2017-05-27 NOTE — Progress Notes (Signed)
Patient ID: Steve Wolfe, male   DOB: 03/23/1981, 37 y.o.   MRN: [id number]  PROGRESS NOTE    Steve Wolfe  [id number] DOB: 02/15/1981 DOA: 05/26/2017 PCP: Patient, No Pcp Per   Outpatient Specialists: None   Brief Narrative:  A 37 year old male admitted with right arm cellulitis that has failed outpatient treatment.  Patient is on IV antibiotics.  His arm is swollen and tender.  Admitted for inpatient treatment for cellulitis probably as a result of  Abrasion.  Assessment & Plan:   Principal Problem:   Cellulitis of right elbow   #1 Cellulitis of the right elbow: Continue IV antibiotics. Culture and sensitivity is pending.  Elevate the arm.   DVT prophylaxis: SCD  Code Status: full  Family Communication: none  Disposition Plan: Home  Consultants:   None  Procedures:  None  Antimicrobials: Vancomycin  Subjective: Patient is complaining of pain.  His right arm, elbow and forearm is swollen tender right.  He is afebrile this morning  Objective: Vitals:   05/26/17 2330 05/27/17 0000 05/27/17 0028 05/27/17 1349  BP: 121/81 120/84 125/81 119/67  Pulse: 83 89 89 94  Resp: 20 18 18 18   Temp:    98.6 F (37 C)  TempSrc:    Oral  SpO2: 96% 95% 98% 98%  Weight:      Height:        Intake/Output Summary (Last 24 hours) at 05/27/2017 1805 Last data filed at 05/27/2017 1443 Gross per 24 hour  Intake 2130 ml  Output -  Net 2130 ml   Filed Weights   05/26/17 2018  Weight: 114.8 kg (253 lb)    Examination:  General exam: Appears calm and comfortable  Respiratory system: Clear to auscultation. Respiratory effort normal. Cardiovascular system: S1 & S2 heard, RRR. No JVD, murmurs, rubs, gallops or clicks. No pedal edema. Gastrointestinal system: Abdomen is nondistended, soft and nontender. No organomegaly or masses felt. Normal bowel sounds heard. Central nervous system: Alert and oriented. No focal neurological deficits. Extremities: Symmetric 5 x 5 power. Skin: No  rashes, lesions or ulcers Psychiatry: Judgement and insight appear normal. Mood & affect appropriate.     Data Reviewed: I have personally reviewed following labs and imaging studies  CBC: Recent Labs  Lab 05/26/17 2052 05/27/17 0147  WBC 9.7 8.3  NEUTROABS 7.9*  --   HGB 13.6 12.9*  HCT 40.0 37.7*  MCV 87.9 86.9  PLT 200 178   Basic Metabolic Panel: Recent Labs  Lab 05/26/17 2052 05/27/17 0147  NA 137 137  K 3.9 3.8  CL 105 108  CO2 24 22  GLUCOSE 137* 113*  BUN 16 14  CREATININE 1.35* 1.07  CALCIUM 8.8* 8.5*   GFR: Estimated Creatinine Clearance: 121.3 mL/min (by C-G formula based on SCr of 1.07 mg/dL). Liver Function Tests: Recent Labs  Lab 05/26/17 2052  AST 34  ALT 39  ALKPHOS 66  BILITOT 0.5  PROT 7.5  ALBUMIN 3.8   No results for input(s): LIPASE, AMYLASE in the last 168 hours. No results for input(s): AMMONIA in the last 168 hours. Coagulation Profile: No results for input(s): INR, PROTIME in the last 168 hours. Cardiac Enzymes: No results for input(s): CKTOTAL, CKMB, CKMBINDEX, TROPONINI in the last 168 hours. BNP (last 3 results) No results for input(s): PROBNP in the last 8760 hours. HbA1C: No results for input(s): HGBA1C in the last 72 hours. CBG: No results for input(s): GLUCAP in the last 168 hours. Lipid Profile: No results for  input(s): CHOL, HDL, LDLCALC, TRIG, CHOLHDL, LDLDIRECT in the last 72 hours. Thyroid Function Tests: No results for input(s): TSH, T4TOTAL, FREET4, T3FREE, THYROIDAB in the last 72 hours. Anemia Panel: No results for input(s): VITAMINB12, FOLATE, FERRITIN, TIBC, IRON, RETICCTPCT in the last 72 hours. Urine analysis:    Component Value Date/Time   COLORURINE YELLOW 05/26/2017 2052   APPEARANCEUR CLEAR 05/26/2017 2052   LABSPEC 1.020 05/26/2017 2052   PHURINE 6.0 05/26/2017 2052   GLUCOSEU NEGATIVE 05/26/2017 2052   HGBUR MODERATE (A) 05/26/2017 2052   BILIRUBINUR NEGATIVE 05/26/2017 2052   KETONESUR  NEGATIVE 05/26/2017 2052   PROTEINUR NEGATIVE 05/26/2017 2052   NITRITE NEGATIVE 05/26/2017 2052   LEUKOCYTESUR NEGATIVE 05/26/2017 2052   Sepsis Labs: @LABRCNTIP (procalcitonin:4,lacticidven:4)  )No results found for this or any previous visit (from the past 240 hour(s)).       Radiology Studies: No results found.      Scheduled Meds: . enoxaparin (LOVENOX) injection  60 mg Subcutaneous Daily  . ibuprofen  600 mg Oral TID  . [START ON 05/28/2017] Influenza vac split quadrivalent PF  0.5 mL Intramuscular Tomorrow-1000   Continuous Infusions: . vancomycin Stopped (05/27/17 1211)     LOS: 1 day    Time spent: 4    Junice Fei,LAWAL, MD [facility] Pager [phone]  If 7PM-7AM, please contact night-coverage [email or web address] Password [email or web address] 05/27/2017, 6:05 PM

## 2017-05-27 NOTE — Progress Notes (Signed)
Rx Brief note Lovenox Rx adjusted Lovenox to 60 mg (~ 0.5 mg/kg) daily in pt with BMI=34  Thanks Lorenza EvangelistGreen, Shadrack Brummitt R 05/27/2017. 2:18 AM

## 2017-05-27 NOTE — H&P (Signed)
History and Physical    [facility] UJW:[id number] DOB: 12/28/1980 DOA: 05/26/2017  PCP: Patient, No Pcp Per  Patient coming from: Home  I have personally briefly [facility]  Chief Complaint: Fever, cellulitis  HPI: Steve Wolfe is a 37 y.o. male with no significant PMH.  [facility] with c/o cellulitis of L elbow and forearm.  Seen 2x at [facility] by Dr. Pearletha ForgeHudnall since onset this past week.  Started on Keflex and doxy.  Swelling had gone down but at 4pm today began to have fevers, chills, worsening redness beyond site of prior demarcation.  Dr. Pearletha ForgeHudnall had checked it 2x in office with US but didn't see fluid collection on Wed nor Thurs.   ED Course: Tm 100.5.  Started on Vanc.   Review of Systems: As per HPI otherwise 10 point review of systems negative.   History reviewed. No pertinent past medical history.  History reviewed. No pertinent surgical history.   reports that  has never smoked. he has never used smokeless tobacco. His alcohol and drug histories are not on file.  No Known Allergies  History reviewed. No pertinent family history.   Prior to Admission medications   Medication Sig Start Date End Date Taking? Authorizing Provider  cephALEXin (KEFLEX) 500 MG capsule Take 1 capsule (500 mg total) by mouth 4 (four) times daily. 05/24/17   Wolfe, Steve FallenShane R, MD  doxycycline (VIBRAMYCIN) 100 MG capsule Take 1 capsule (100 mg total) by mouth 2 (two) times daily. One po bid x 7 days 05/25/17   Steve Wolfe, April, MD  HYDROcodone-acetaminophen (NORCO) 5-325 MG tablet Take 1 tablet by mouth every 6 (six) hours as needed for moderate pain. 05/24/17   Lenda KelpHudnall, Steve R, MD  ibuprofen (ADVIL,MOTRIN) 800 MG tablet Take 800 mg by mouth. 10/05/16   [provider]  ibuprofen (ADVIL,MOTRIN) 800 MG tablet Take 1 tablet (800 mg total) by mouth 3 (three) times daily. 05/25/17   Wolfe, April, MD    Physical Exam: Vitals:   05/26/17  2315 05/26/17 2330 05/27/17 0000 05/27/17 0028  BP: 126/79 121/81 120/84 125/81  Pulse: 84 83 89 89  Resp: 20 20 18 18   Temp: 98.8 F (37.1 C)     TempSrc: Oral     SpO2: 95% 96% 95% 98%  Weight:      Height:        Constitutional: NAD, calm, comfortable Eyes: PERRL, lids and conjunctivae normal ENMT: Mucous membranes are moist. Posterior pharynx clear of any exudate or lesions.Normal dentition.  Neck: normal, supple, no masses, no thyromegaly Respiratory: clear to auscultation bilaterally, no wheezing, no crackles. Normal respiratory effort. No accessory muscle use.  Cardiovascular: Regular rate and rhythm, no murmurs / rubs / gallops. No extremity edema. 2+ pedal pulses. No carotid bruits.  Abdomen: no tenderness, no masses palpated. No hepatosplenomegaly. Bowel sounds positive.  Musculoskeletal: no clubbing / cyanosis. No joint deformity upper and lower extremities. Good ROM, no contractures. Normal muscle tone.  Skin:    Neurologic: CN 2-12 grossly intact. Sensation intact, DTR normal. Strength 5/5 in all 4.  Psychiatric: Normal judgment and insight. Alert and oriented x 3. Normal mood.    Labs on Admission: I have personally reviewed following labs and imaging studies  CBC: Recent Labs  Lab 05/26/17 2052  WBC 9.7  NEUTROABS 7.9*  HGB 13.6  HCT 40.0  MCV 87.9  PLT 200   Basic Metabolic Panel: Recent Labs  Lab 05/26/17 2052  NA 137  K 3.9  CL 105  CO2 24  GLUCOSE 137*  BUN 16  CREATININE 1.35*  CALCIUM 8.8*   GFR: Estimated Creatinine Clearance: 96.1 mL/min (A) (by C-G formula based on SCr of 1.35 mg/dL (H)). Liver Function Tests: Recent Labs  Lab 05/26/17 2052  AST 34  ALT 39  ALKPHOS 66  BILITOT 0.5  PROT 7.5  ALBUMIN 3.8   No results for input(s): LIPASE, AMYLASE in the last 168 hours. No results for input(s): AMMONIA in the last 168 hours. Coagulation Profile: No results for input(s): INR, PROTIME in the last 168 hours. Cardiac Enzymes: No  results for input(s): CKTOTAL, CKMB, CKMBINDEX, TROPONINI in the last 168 hours. BNP (last 3 results) No results for input(s): PROBNP in the last 8760 hours. HbA1C: No results for input(s): HGBA1C in the last 72 hours. CBG: No results for input(s): GLUCAP in the last 168 hours. Lipid Profile: No results for input(s): CHOL, HDL, LDLCALC, TRIG, CHOLHDL, LDLDIRECT in the last 72 hours. Thyroid Function Tests: No results for input(s): TSH, T4TOTAL, FREET4, T3FREE, THYROIDAB in the last 72 hours. Anemia Panel: No results for input(s): VITAMINB12, FOLATE, FERRITIN, TIBC, IRON, RETICCTPCT in the last 72 hours. Urine analysis:    Component Value Date/Time   COLORURINE YELLOW 05/26/2017 2052   APPEARANCEUR CLEAR 05/26/2017 2052   LABSPEC 1.020 05/26/2017 2052   PHURINE 6.0 05/26/2017 2052   GLUCOSEU NEGATIVE 05/26/2017 2052   HGBUR MODERATE (A) 05/26/2017 2052   BILIRUBINUR NEGATIVE 05/26/2017 2052   KETONESUR NEGATIVE 05/26/2017 2052   PROTEINUR NEGATIVE 05/26/2017 2052   NITRITE NEGATIVE 05/26/2017 2052   LEUKOCYTESUR NEGATIVE 05/26/2017 2052    Radiological Exams on Admission: Dg Elbow Complete Right  Result Date: 05/25/2017 CLINICAL DATA:  Acute onset of right elbow pain, swelling and erythema. EXAM: RIGHT ELBOW - COMPLETE 3+ VIEW COMPARISON:  None. FINDINGS: There is no evidence of fracture or dislocation. The visualized joint spaces are preserved. No significant joint effusion is identified. Diffuse soft tissue swelling is noted about the elbow. No radiopaque foreign bodies are seen. IMPRESSION: No evidence of fracture or dislocation. Diffuse soft tissue swelling noted. Electronically Signed   By: Steve Wolfe M.D.   On: 05/25/2017 05:47    EKG: Independently reviewed.  Assessment/Plan Principal Problem:   Cellulitis of right elbow    1. Cellulitis of R elbow - DDx includes septic olecranon bursitis, good ROM at elbow so doubt septic arthritis. 1. Continue vanc (failed  outpatient keflex and doxy) 2. Repeat labs in AM 3. May want to have ortho surg eyeball this in the AM to make call on if its septic bursitis / need aspiration or surgical clean out.  DVT prophylaxis: Lovenox Code Status: Full Family Communication: No family in room Disposition Plan: Home after admit Consults called: None Admission status: Admit to inpatient - IP status as failed appropriate outpatient treatment.   Hillary Bow DO [facility] Pager [phone]  If 7AM-7PM, please contact day team taking care of patient [email or web address] Password [email or web address]  05/27/2017, 1:32 AM

## 2017-05-28 DIAGNOSIS — L03113 Cellulitis of right upper limb: Principal | ICD-10-CM

## 2017-05-28 LAB — CREATININE, SERUM
Creatinine, Ser: 1.18 mg/dL (ref 0.61–1.24)
GFR calc Af Amer: >60 mL/min (ref >60)
GFR calc non Af Amer: >60 mL/min (ref >60)

## 2017-05-28 LAB — CBC WITH DIFFERENTIAL/PLATELET
Basophils Absolute: 0 10*3/uL (ref 0.0–0.1)
Basophils Relative: 0 %
EOS ABS: 0.3 10*3/uL (ref 0.0–0.7)
Eosinophils Relative: 3 %
HCT: 38.5 % — ABNORMAL LOW (ref 39.0–52.0)
HEMOGLOBIN: 13.2 g/dL (ref 13.0–17.0)
LYMPHS ABS: 1.7 10*3/uL (ref 0.7–4.0)
Lymphocytes Relative: 18 %
MCH: 29.8 pg (ref 26.0–34.0)
MCHC: 34.3 g/dL (ref 30.0–36.0)
MCV: 86.9 fL (ref 78.0–100.0)
Monocytes Absolute: 0.7 10*3/uL (ref 0.1–1.0)
Monocytes Relative: 7 %
NEUTROS ABS: 6.6 10*3/uL (ref 1.7–7.7)
NEUTROS PCT: 72 %
Platelets: 236 10*3/uL (ref 150–400)
RBC: 4.43 MIL/uL (ref 4.22–5.81)
RDW: 14.1 % (ref 11.5–15.5)
WBC: 9.3 10*3/uL (ref 4.0–10.5)

## 2017-05-28 MED ORDER — SULFAMETHOXAZOLE-TRIMETHOPRIM 400-80 MG PO TABS: 1.0000 | ORAL_TABLET | Freq: Two times a day (BID) | ORAL | 0 refills | Status: DC

## 2017-05-28 MED ORDER — VANCOMYCIN HCL IN DEXTROSE 1-5 GM/200ML-% IV SOLN: 1000.0000 mg | Freq: Two times a day (BID) | INTRAVENOUS | Status: DC

## 2017-05-28 NOTE — Discharge Summary (Signed)
Physician Discharge Summary  [facility] ZOX:[id number] DOB: 10/11/1980 DOA: 05/26/2017  PCP: Patient, No Pcp Per  Admit date: 05/26/2017 Discharge date: 05/28/2017  Admitted From: Home Disposition: Home  Recommendations for Outpatient Follow-up:  1. Follow up with PCP in 1-2 weeks 2. Please obtain BMP/CBC in one week  Discharge Condition: Stable (Stable, guarded, hospice) CODE STATUS: Full (FULL, DNR, Comfort Care) Diet recommendation: Regular Brief/Interim Summary: Steve Wolfe is a 37 y.o. male with no significant PMH.  [facility] with c/o cellulitis of L elbow and forearm.  Seen 2x at UC by Dr. Pearletha ForgeHudnall since onset this past week.  Started on Keflex and doxy.  Swelling had gone down but at 4pm today began to have fevers, chills, worsening redness beyond site of prior demarcation.  Dr. Pearletha ForgeHudnall had checked it 2x in office with US but didn't see fluid collection on Wed nor Thurs.   Discharge Diagnoses:  Principal Problem:   Cellulitis of right elbow  ##) Cellulitis: Patient was admitted with failure of outpatient antibiotics with cellulitis of the right upper extremity.  Patient was given IV vancomycin with dramatic improvement in erythema pain and swelling.  Patient had return of function.  Patient's white blood cell count was never elevated.  Patient did not have any further fevers after being hospitalized.  Patient was told to discontinue oral doxycycline and transition to oral Bactrim for 7 days.  Patient was instructed to follow-up with his PCP in 1 week.  Strict return precautions given.  Discharge Instructions  Discharge Instructions    Call MD for:  redness, tenderness, or signs of infection (pain, swelling, redness, odor or green/yellow discharge around incision site)   Complete by:  As directed    Call MD for:  temperature >100.4   Complete by:  As directed    Diet - low sodium heart healthy   Complete by:  As directed    Discharge instructions   Complete  by:  As directed    Please find a primary care doctor to follow-up with.  Please stop taking doxycycline.  Please take Bactrim as prescribed for 7 days.   Increase activity slowly   Complete by:  As directed      Allergies as of 05/28/2017   No Known Allergies     Medication List    STOP taking these medications   doxycycline 100 MG capsule Commonly known as:  VIBRAMYCIN     TAKE these medications   cephALEXin 500 MG capsule Commonly known as:  KEFLEX Take 1 capsule (500 mg total) by mouth 4 (four) times daily.   HYDROcodone-acetaminophen 5-325 MG tablet Commonly known as:  NORCO Take 1 tablet by mouth every 6 (six) hours as needed for moderate pain.   ibuprofen 800 MG tablet Commonly known as:  ADVIL,MOTRIN Take 1 tablet (800 mg total) by mouth 3 (three) times daily. What changed:    when to take this  reasons to take this   sulfamethoxazole-trimethoprim 400-80 MG tablet Commonly known as:  BACTRIM Take 1 tablet by mouth 2 (two) times daily for 7 days.       No Known Allergies  Consultations:  None    Procedures/Studies: Dg Elbow Complete Right  Result Date: 05/25/2017 CLINICAL DATA:  Acute onset of right elbow pain, swelling and erythema. EXAM: RIGHT ELBOW - COMPLETE 3+ VIEW COMPARISON:  None. FINDINGS: There is no evidence of fracture or dislocation. The visualized joint spaces are preserved. No significant joint effusion is  identified. Diffuse soft tissue swelling is noted about the elbow. No radiopaque foreign bodies are seen. IMPRESSION: No evidence of fracture or dislocation. Diffuse soft tissue swelling noted. Electronically Signed   By: Roanna Raider M.D.   On: 05/25/2017 05:47       Subjective:   Discharge Exam: Vitals:   05/27/17 2038 05/28/17 0523  BP: 137/84 115/64  Pulse: 85 78  Resp: 17 16  Temp: 98.2 F (36.8 C) 98 F (36.7 C)  SpO2: 94% 96%   Vitals:   05/27/17 0028 05/27/17 1349 05/27/17 2038 05/28/17 0523  BP: 125/81 119/67  137/84 115/64  Pulse: 89 94 85 78  Resp: 18 18 17 16   Temp:  98.6 F (37 C) 98.2 F (36.8 C) 98 F (36.7 C)  TempSrc:  Oral Oral Oral  SpO2: 98% 98% 94% 96%  Weight:      Height:        General: Pt is alert, awake, not in acute distress Cardiovascular: RRR, S1/S2 +, no rubs, no gallops Respiratory: CTA bilaterally, no wheezing, no rhonchi Abdominal: Soft, NT, ND, bowel sounds + Extremities: Mild swelling and erythema over right upper extremity, no fluctuance, good pulses and good cap refill distally    The results of significant diagnostics from this hospitalization (including imaging, microbiology, ancillary and laboratory) are listed below for reference.     Microbiology: Recent Results (from the past 240 hour(s))  Culture, blood (routine x 2)     Status: None (Preliminary result)   Collection Time: 05/27/17  1:47 AM  Result Value Ref Range Status   Specimen Description   Final    BLOOD LEFT HAND Performed at [facility], [address] [place], [place] [address]    Special Requests   Final    BOTTLES DRAWN AEROBIC AND ANAEROBIC Blood Culture adequate volume Performed at [facility], [address] [place], [place] [address]    Culture   Final    NO GROWTH < 24 HOURS Performed at [facility], [address] [place], [place] [address]    Report Status PENDING  Incomplete  Culture, blood (routine x 2)     Status: None (Preliminary result)   Collection Time: 05/27/17  1:53 AM  Result Value Ref Range Status   Specimen Description   Final    BLOOD RIGHT HAND Performed at [facility], [address] [place], [place] [address]    Special Requests   Final    BOTTLES DRAWN AEROBIC AND ANAEROBIC Blood Culture adequate volume Performed at [facility], [address], [place], [place] [address]    Culture   Final    NO GROWTH < 24 HOURS Performed at [facility], [address] [place], [place] [address]    Report Status PENDING  Incomplete     Labs: BNP (last 3 results) No results for input(s): BNP in the last 8760 hours. Basic Metabolic Panel: Recent Labs  Lab 05/26/17 2052 05/27/17 0147 05/28/17 0542  NA 137 137  --   K 3.9 3.8  --   CL 105 108  --   CO2 24 22  --   GLUCOSE 137* 113*  --   BUN 16 14  --   CREATININE 1.35* 1.07 1.18  CALCIUM 8.8* 8.5*  --    Liver Function Tests: Recent Labs  Lab 05/26/17 2052  AST 34  ALT 39  ALKPHOS 66  BILITOT 0.5  PROT 7.5  ALBUMIN 3.8   No results for input(s): LIPASE, AMYLASE in the last 168 hours. No results for input(s): AMMONIA in the last 168 hours. CBC: Recent Labs  Lab 05/26/17 2052 05/27/17 0147 05/28/17 0542  WBC 9.7 8.3 9.3  NEUTROABS 7.9*  --  6.6  HGB 13.6 12.9* 13.2  HCT 40.0 37.7* 38.5*  MCV 87.9 86.9 86.9  PLT 200 178 236   Cardiac Enzymes: No results for input(s): CKTOTAL, CKMB, CKMBINDEX, TROPONINI in the last 168 hours. BNP: Invalid input(s): POCBNP CBG: No results for input(s): GLUCAP in the last 168 hours. D-Dimer No results for input(s): DDIMER in the last 72 hours. Hgb A1c No results for input(s): HGBA1C in the last 72 hours. Lipid Profile No results for input(s): CHOL, HDL, LDLCALC, TRIG, CHOLHDL, LDLDIRECT in the last 72 hours. Thyroid function studies No results for input(s): TSH, T4TOTAL, T3FREE, THYROIDAB in the last 72 hours.  Invalid input(s): FREET3 Anemia work up No results for input(s): VITAMINB12, FOLATE, FERRITIN, TIBC, IRON, RETICCTPCT in the last 72 hours. Urinalysis    Component Value Date/Time   COLORURINE YELLOW 05/26/2017 2052   APPEARANCEUR CLEAR 05/26/2017 2052   LABSPEC 1.020 05/26/2017 2052   PHURINE 6.0 05/26/2017 2052   GLUCOSEU NEGATIVE 05/26/2017 2052   HGBUR MODERATE (A) 05/26/2017 2052   BILIRUBINUR NEGATIVE 05/26/2017 2052   KETONESUR NEGATIVE 05/26/2017 2052   PROTEINUR NEGATIVE 05/26/2017 2052   NITRITE NEGATIVE  05/26/2017 2052   LEUKOCYTESUR NEGATIVE 05/26/2017 2052   Sepsis Labs Invalid input(s): PROCALCITONIN,  WBC,  LACTICIDVEN Microbiology Recent Results (from the past 240 hour(s))  Culture, blood (routine x 2)     Status: None (Preliminary result)   Collection Time: 05/27/17  1:47 AM  Result Value Ref Range Status   Specimen Description   Final    BLOOD LEFT HAND Performed at [facility], [address] [place], [place] [address]    Special Requests   Final    BOTTLES DRAWN AEROBIC AND ANAEROBIC Blood Culture adequate volume Performed at [facility] [address] [place], [place] [address]    Culture   Final    NO GROWTH < 24 HOURS Performed at [facility], [address] [place], [place] [address]    Report Status PENDING  Incomplete  Culture, blood (routine x 2)     Status: None (Preliminary result)   Collection Time: 05/27/17  1:53 AM  Result Value Ref Range Status   Specimen Description   Final    BLOOD RIGHT HAND Performed at [facility], [address], [place], [place] [address]    Special Requests   Final    BOTTLES DRAWN AEROBIC AND ANAEROBIC Blood Culture adequate volume Performed at [facility], [address], [place], [place] [address]    Culture   Final    NO GROWTH < 24 HOURS Performed at [facility], [address] [place], [place] [address]    Report Status PENDING  Incomplete     Time coordinating discharge: Over 30 minutes  SIGNED:   Delaine Lame, MD  [facility] 05/28/2017, 11:45 AM   If 7PM-7AM, please contact night-coverage [email or web address] Password [email or web address]

## 2017-05-28 NOTE — Progress Notes (Signed)
Discharge instructions and medications discussed with patient.  AVS & prescription given to patient.  All questions answered.  

## 2017-05-28 NOTE — Discharge Instructions (Signed)

## 2017-05-28 NOTE — Progress Notes (Signed)
Pharmacy Antibiotic Note  Steve Wolfe is a 37 y.o. male admitted on 05/26/2017 with cellulitis.  Pharmacy has been consulted for vancomcyin dosing.  Today, 05/28/2017: SCr 1.18 WBC remains WNL Afebrile  Plan:  Increase to Vancomycin 1000mg  IV q12h.  Measure Vanc peak, trough at steady state if continued  Follow up renal fxn, culture results, and clinical course.   Height: 6' (182.9 cm) Weight: 253 lb (114.8 kg) IBW/kg (Calculated) : 77.6  Temp (24hrs), Avg:98.3 F (36.8 C), Min:98 F (36.7 C), Max:98.6 F (37 C)  Recent Labs  Lab 05/26/17 2052 05/26/17 2101 05/27/17 0147 05/28/17 0542  WBC 9.7  --  8.3 9.3  CREATININE 1.35*  --  1.07  --   LATICACIDVEN  --  1.10  --   --     Estimated Creatinine Clearance: 121.3 mL/min (by C-G formula based on SCr of 1.07 mg/dL).    No Known Allergies  Antimicrobials this admission: 2/8 Vancomycin >>  Dose adjustments this admission:  Microbiology results: 2/9 BCx: ngtd  Thank you for allowing pharmacy to be a part of this patient's care.  Lynann Beaverhristine Amaris Delafuente PharmD, BCPS Pager [phone] 05/28/2017 10:24 AM

## 2017-05-29 ENCOUNTER — Ambulatory Visit: Payer: BLUE CROSS/BLUE SHIELD | Admitting: Family Medicine

## 2017-05-30 ENCOUNTER — Encounter: Payer: Self-pay | Admitting: Family Medicine

## 2017-05-30 ENCOUNTER — Ambulatory Visit (INDEPENDENT_AMBULATORY_CARE_PROVIDER_SITE_OTHER): Payer: BLUE CROSS/BLUE SHIELD | Admitting: Family Medicine

## 2017-05-30 DIAGNOSIS — L03113 Cellulitis of right upper limb: Secondary | ICD-10-CM

## 2017-05-30 MED ORDER — SULFAMETHOXAZOLE-TRIMETHOPRIM 800-160 MG PO TABS: 2.0000 | ORAL_TABLET | Freq: Two times a day (BID) | ORAL | 0 refills | Status: DC

## 2017-05-30 NOTE — Assessment & Plan Note (Addendum)
He is afebrile since his hospitalization - given IV vanc there and transitioned to bactrim.  Noted his dosage of bactrim is less than typical dosage for severe bacterial infections.  This may explain why there has not been much improvement since his hospital stay - advised to increase his bactrim to 4 tabs twice a day and when he runs out to fill Rx I sent in of Bactrim DS 2 tabs twice a day for 7 more days.  Finish keflex.  Again close follow up scheduled for Thursday and reviewed red flags to warrant ED visit (fever >100.4, worsening pain, redness spreading despite antibiotics, dizziness).  No evidence abscess, septic arthritis or bursitis. Believe fine blisters are due to the profound swelling he had, unlikely erysipelas given irregularly defined borders, clear cellulitis at beginning of presentation, and rash is not raised otherwise.

## 2017-05-30 NOTE — Progress Notes (Signed)
PCP: Patient, No Pcp Per  Subjective:   HPI: Patient is a 37 y.o. male here for right elbow pain, swelling.  2/6: Patient reports about 3 weeks ago he recalls scraping his right elbow on the kitchen counter. He was sick around this time with some congestion, fever, had to take nyquil and improved. Elbow area crusted over. Then about 1 day ago he noticed right elbow was swelling, becoming more red, difficulty with extending this. Pain level 7/10 and sharp with extension of elbow posteriorly. No other skin changes, numbness.  2/7: Patient returns today for reevaluation of his elbow. He is taking his keflex without a problem. Went to ED overnight because of pain - was given shot of toradol, had radiographs that were normal except for soft tissue swelling (no effusion), doxycycline was added. He reports he feels much better though arm is more swollen. Pain level down to 5/10, can straighten elbow better. No other skin changes, numbness. Denies fevers.  2/8: Patient returns today reporting he feels better. Still with swelling, redness but no fevers. Pain level continues to come down to 2/10 level. Tolerating doxycycline and keflex without side effects, taking as directed. Taking ibuprofen. Able to extend elbow easier. No other skin changes, numbness.  2/12: Patient returns for follow-up. He went to the ED the evening of 2/8 having spiked a temperature to 103 degrees with muscle aches, dizziness. He did well with IV vancomycin and was transitioned to oral bactrim, discharged on 2/10. Had no fevers during hospital stay and WBC count was normal. He reports still feeling well. States redness is about the same as it was past 2 days Some itchiness but pain is 0/10 He is taking bactrim and finishing his keflex. No fevers, chills, sweats, other complaints following his discharge.  History reviewed. No pertinent past medical history.  Current Outpatient Medications on File Prior to  Visit  Medication Sig Dispense Refill  . cephALEXin (KEFLEX) 500 MG capsule Take 1 capsule (500 mg total) by mouth 4 (four) times daily. 28 capsule 0  . HYDROcodone-acetaminophen (NORCO) 5-325 MG tablet Take 1 tablet by mouth every 6 (six) hours as needed for moderate pain. 20 tablet 0  . ibuprofen (ADVIL,MOTRIN) 800 MG tablet Take 1 tablet (800 mg total) by mouth 3 (three) times daily. (Patient taking differently: Take 800 mg by mouth every 8 (eight) hours as needed for fever, mild pain or moderate pain. ) 21 tablet 0   No current facility-administered medications on file prior to visit.     History reviewed. No pertinent surgical history.  No Known Allergies  Social History   Socioeconomic History  . Marital status: Single    Spouse name: Not on file  . Number of children: Not on file  . Years of education: Not on file  . Highest education level: Not on file  Social Needs  . Financial resource strain: Not on file  . Food insecurity - worry: Not on file  . Food insecurity - inability: Not on file  . Transportation needs - medical: Not on file  . Transportation needs - non-medical: Not on file  Occupational History  . Not on file  Tobacco Use  . Smoking status: Never Smoker  . Smokeless tobacco: Never Used  Substance and Sexual Activity  . Alcohol use: Not on file  . Drug use: Not on file  . Sexual activity: Not on file  Other Topics Concern  . Not on file  Social History Narrative  . Not on  file    History reviewed. No pertinent family history.  BP 138/85   Pulse 77   Temp 97.7 F (36.5 C) (Oral)   Ht 6' (1.829 m)   Wt 253 lb (114.8 kg)   BMI 34.31 kg/m   Review of Systems: See HPI above.     Objective:  Physical Exam:  Gen: NAD, comfortable in exam room.  Right elbow/forearm: Erythema from distal upper arm through to just proximal to his wrist.  There are very fine blisters proximal forearm.  Area throughout is mildly warm.  No palpable abscess or other  mass. FROM elbow and wrist with 5/5 strength and no pain. NVI distally.   Assessment & Plan:  1. Cellulitis of right elbow - He is afebrile since his hospitalization - given IV vanc there and transitioned to bactrim.  Noted his dosage of bactrim is less than typical dosage for severe bacterial infections.  This may explain why there has not been much improvement since his hospital stay - advised to increase his bactrim to 4 tabs twice a day and when he runs out to fill Rx I sent in of Bactrim DS 2 tabs twice a day for 7 more days.  Finish keflex.  Again close follow up scheduled for Thursday and reviewed red flags to warrant ED visit (fever >100.4, worsening pain, redness spreading despite antibiotics, dizziness).  No evidence abscess, septic arthritis or bursitis. Believe fine blisters are due to the profound swelling he had, unlikely erysipelas given irregularly defined borders, clear cellulitis at beginning of presentation, and rash is not raised otherwise.

## 2017-06-01 ENCOUNTER — Ambulatory Visit (INDEPENDENT_AMBULATORY_CARE_PROVIDER_SITE_OTHER): Payer: BLUE CROSS/BLUE SHIELD | Admitting: Family Medicine

## 2017-06-01 ENCOUNTER — Encounter: Payer: Self-pay | Admitting: Family Medicine

## 2017-06-01 DIAGNOSIS — L03113 Cellulitis of right upper limb: Secondary | ICD-10-CM

## 2017-06-01 LAB — CULTURE, BLOOD (ROUTINE X 2)
Culture: NO GROWTH
Culture: NO GROWTH
SPECIAL REQUESTS: ADEQUATE
Special Requests: ADEQUATE

## 2017-06-01 NOTE — Progress Notes (Signed)
PCP: Steve Wolfe, No Pcp Per  Subjective:   HPI: Steve Wolfe is a 37 y.o. male here for right elbow pain, swelling.  2/6: Steve Wolfe reports about 3 weeks ago he recalls scraping his right elbow on the kitchen counter. He was sick around this time with some congestion, fever, had to take nyquil and improved. Elbow area crusted over. Then about 1 day ago he noticed right elbow was swelling, becoming more red, difficulty with extending this. Pain level 7/10 and sharp with extension of elbow posteriorly. No other skin changes, numbness.  2/7: Steve Wolfe returns today for reevaluation of his elbow. He is taking his keflex without a problem. Went to ED overnight because of pain - was given shot of toradol, had radiographs that were normal except for soft tissue swelling (no effusion), doxycycline was added. He reports he feels much better though arm is more swollen. Pain level down to 5/10, can straighten elbow better. No other skin changes, numbness. Denies fevers.  2/8: Steve Wolfe returns today reporting he feels better. Still with swelling, redness but no fevers. Pain level continues to come down to 2/10 level. Tolerating doxycycline and keflex without side effects, taking as directed. Taking ibuprofen. Able to extend elbow easier. No other skin changes, numbness.  2/12: Steve Wolfe returns for follow-up. He went to the ED the evening of 2/8 having spiked a temperature to 103 degrees with muscle aches, dizziness. He did well with IV vancomycin and was transitioned to oral bactrim, discharged on 2/10. Had no fevers during hospital stay and WBC count was normal. He reports still feeling well. States redness is about the same as it was past 2 days Some itchiness but pain is 0/10 He is taking bactrim and finishing his keflex. No fevers, chills, sweats, other complaints following his discharge.  2/14: Steve Wolfe reports he's doing well. Some itching in area of rash, applying benadryl cream. Finished  keflex. Doing well with bactrim twice a day. No fevers, chills, sweats. Pain level 0/10.  History reviewed. No pertinent past medical history.  Current Outpatient Medications on File Prior to Visit  Medication Sig Dispense Refill  . cephALEXin (KEFLEX) 500 MG capsule Take 1 capsule (500 mg total) by mouth 4 (four) times daily. 28 capsule 0  . HYDROcodone-acetaminophen (NORCO) 5-325 MG tablet Take 1 tablet by mouth every 6 (six) hours as needed for moderate pain. 20 tablet 0  . ibuprofen (ADVIL,MOTRIN) 800 MG tablet Take 1 tablet (800 mg total) by mouth 3 (three) times daily. (Steve Wolfe taking differently: Take 800 mg by mouth every 8 (eight) hours as needed for fever, mild pain or moderate pain. ) 21 tablet 0  . sulfamethoxazole-trimethoprim (BACTRIM DS,SEPTRA DS) 800-160 MG tablet Take 2 tablets by mouth 2 (two) times daily. 28 tablet 0   No current facility-administered medications on file prior to visit.     History reviewed. No pertinent surgical history.  No Known Allergies  Social History   Socioeconomic History  . Marital status: Single    Spouse name: Not on file  . Number of children: Not on file  . Years of education: Not on file  . Highest education level: Not on file  Social Needs  . Financial resource strain: Not on file  . Food insecurity - worry: Not on file  . Food insecurity - inability: Not on file  . Transportation needs - medical: Not on file  . Transportation needs - non-medical: Not on file  Occupational History  . Not on file  Tobacco Use  .  Smoking status: Never Smoker  . Smokeless tobacco: Never Used  Substance and Sexual Activity  . Alcohol use: Not on file  . Drug use: Not on file  . Sexual activity: Not on file  Other Topics Concern  . Not on file  Social History Narrative  . Not on file    History reviewed. No pertinent family history.  BP 122/75   Pulse 74   Temp 98.1 F (36.7 C) (Oral)   Ht 6' (1.829 m)   Wt 253 lb (114.8 kg)    BMI 34.31 kg/m   Review of Systems: See HPI above.     Objective:  Physical Exam:  Gen: NAD, comfortable in exam room.  Right elbow/forearm: Interval improvement noted compared to visit 2 days ago - erythema at elbow down to 3/4th of way down forearm.  No longer with fine blisters.  Still with mild warmth over area.  No palpable abscess or mass.  1+ pitting edema proximal forearm. FROM elbow and wrist with 5/5 strength, no pain. NVI distally.  MSK u/s: no abscess noted, muscle involvement.  Course of radius and ulna appear normal.   Assessment & Plan:  1. Cellulitis of right elbow - Clinically improving.  S/p IV vanc.  Just finished keflex and taking bactrim ds 2 bid, tolerating well.  Advised to finish course of bactrim even if rash resolves.  Afebrile.  Will follow up on Monday.  Again reviewed red flags to warrant return trip to ED.  Tylenol if needed for soreness, topical benadryl.  Consider oral antihistamine.

## 2017-06-01 NOTE — Assessment & Plan Note (Signed)
Clinically improving.  S/p IV vanc.  Just finished keflex and taking bactrim ds 2 bid, tolerating well.  Advised to finish course of bactrim even if rash resolves.  Afebrile.  Will follow up on Monday.  Again reviewed red flags to warrant return trip to ED.  Tylenol if needed for soreness, topical benadryl.  Consider oral antihistamine.

## 2017-06-05 ENCOUNTER — Ambulatory Visit: Payer: BLUE CROSS/BLUE SHIELD | Admitting: Family Medicine

## 2017-06-08 ENCOUNTER — Ambulatory Visit (INDEPENDENT_AMBULATORY_CARE_PROVIDER_SITE_OTHER): Payer: BLUE CROSS/BLUE SHIELD | Admitting: Family Medicine

## 2017-06-08 ENCOUNTER — Encounter: Payer: Self-pay | Admitting: Family Medicine

## 2017-06-08 DIAGNOSIS — L03113 Cellulitis of right upper limb: Secondary | ICD-10-CM

## 2017-06-09 ENCOUNTER — Encounter: Payer: Self-pay | Admitting: Family Medicine

## 2017-06-09 NOTE — Progress Notes (Signed)
PCP: Patient, No Pcp Per  Subjective:   HPI: Patient is a 37 y.o. male here for right elbow pain, swelling.  2/6: Patient reports about 3 weeks ago he recalls scraping his right elbow on the kitchen counter. He was sick around this time with some congestion, fever, had to take nyquil and improved. Elbow area crusted over. Then about 1 day ago he noticed right elbow was swelling, becoming more red, difficulty with extending this. Pain level 7/10 and sharp with extension of elbow posteriorly. No other skin changes, numbness.  2/7: Patient returns today for reevaluation of his elbow. He is taking his keflex without a problem. Went to ED overnight because of pain - was given shot of toradol, had radiographs that were normal except for soft tissue swelling (no effusion), doxycycline was added. He reports he feels much better though arm is more swollen. Pain level down to 5/10, can straighten elbow better. No other skin changes, numbness. Denies fevers.  2/8: Patient returns today reporting he feels better. Still with swelling, redness but no fevers. Pain level continues to come down to 2/10 level. Tolerating doxycycline and keflex without side effects, taking as directed. Taking ibuprofen. Able to extend elbow easier. No other skin changes, numbness.  2/12: Patient returns for follow-up. He went to the ED the evening of 2/8 having spiked a temperature to 103 degrees with muscle aches, dizziness. He did well with IV vancomycin and was transitioned to oral bactrim, discharged on 2/10. Had no fevers during hospital stay and WBC count was normal. He reports still feeling well. States redness is about the same as it was past 2 days Some itchiness but pain is 0/10 He is taking bactrim and finishing his keflex. No fevers, chills, sweats, other complaints following his discharge.  2/14: Patient reports he's doing well. Some itching in area of rash, applying benadryl cream. Finished  keflex. Doing well with bactrim twice a day. No fevers, chills, sweats. Pain level 0/10.  2/21: Patient is doing well. No fevers, chills, sweats. Finished bactrim yesterday. Still with a little itchiness only but rash is gone as is the swelling.  History reviewed. No pertinent past medical history.  Current Outpatient Medications on File Prior to Visit  Medication Sig Dispense Refill  . cephALEXin (KEFLEX) 500 MG capsule Take 1 capsule (500 mg total) by mouth 4 (four) times daily. 28 capsule 0  . HYDROcodone-acetaminophen (NORCO) 5-325 MG tablet Take 1 tablet by mouth every 6 (six) hours as needed for moderate pain. 20 tablet 0  . ibuprofen (ADVIL,MOTRIN) 800 MG tablet Take 1 tablet (800 mg total) by mouth 3 (three) times daily. (Patient taking differently: Take 800 mg by mouth every 8 (eight) hours as needed for fever, mild pain or moderate pain. ) 21 tablet 0  . sulfamethoxazole-trimethoprim (BACTRIM DS,SEPTRA DS) 800-160 MG tablet Take 2 tablets by mouth 2 (two) times daily. 28 tablet 0   No current facility-administered medications on file prior to visit.     History reviewed. No pertinent surgical history.  No Known Allergies  Social History   Socioeconomic History  . Marital status: Single    Spouse name: Not on file  . Number of children: Not on file  . Years of education: Not on file  . Highest education level: Not on file  Social Needs  . Financial resource strain: Not on file  . Food insecurity - worry: Not on file  . Food insecurity - inability: Not on file  . Transportation needs -  medical: Not on file  . Transportation needs - non-medical: Not on file  Occupational History  . Not on file  Tobacco Use  . Smoking status: Never Smoker  . Smokeless tobacco: Never Used  Substance and Sexual Activity  . Alcohol use: Not on file  . Drug use: Not on file  . Sexual activity: Not on file  Other Topics Concern  . Not on file  Social History Narrative  . Not on  file    History reviewed. No pertinent family history.  BP 123/88   Pulse 80   Temp 97.7 F (36.5 C)   Ht 6' (1.829 m)   Wt 253 lb (114.8 kg)   BMI 34.31 kg/m   Review of Systems: See HPI above.     Objective:  Physical Exam:  Gen: NAD, comfortable in exam room.  Right elbow/forearm: Rash resolved with dryness, scaling of proximal forearm.  No swelling, bruising, other deformity.  No warmth. No TTP throughout forearm or elbow. FROM elbow and wrist with full strength and no pain. NVI distally.   Assessment & Plan:  1. Cellulitis of right elbow - Resolved with keflex and bactrim, s/p IV vanc in hospital.  Call us with any problems otherwise f/u prn.

## 2017-06-10 NOTE — Addendum Note (Signed)
Addended by: Lenda KelpHUDNALL, Serah Nicoletti R on: 06/10/2017 08:42 AM   Modules accepted: Level of Service

## 2017-06-10 NOTE — Assessment & Plan Note (Signed)
Resolved with keflex and bactrim, s/p IV vanc in hospital.  Call us with any problems otherwise f/u prn.

## 2017-08-23 DIAGNOSIS — S91332A Puncture wound without foreign body, left foot, initial encounter: Secondary | ICD-10-CM | POA: Diagnosis not present

## 2017-11-16 DIAGNOSIS — Z6832 Body mass index (BMI) 32.0-32.9, adult: Secondary | ICD-10-CM | POA: Diagnosis not present

## 2017-11-16 DIAGNOSIS — L739 Follicular disorder, unspecified: Secondary | ICD-10-CM | POA: Diagnosis not present

## 2017-11-25 DIAGNOSIS — Z114 Encounter for screening for human immunodeficiency virus [HIV]: Secondary | ICD-10-CM | POA: Diagnosis not present

## 2017-11-25 DIAGNOSIS — E291 Testicular hypofunction: Secondary | ICD-10-CM | POA: Diagnosis not present

## 2017-11-25 DIAGNOSIS — Z1331 Encounter for screening for depression: Secondary | ICD-10-CM | POA: Diagnosis not present

## 2017-11-25 DIAGNOSIS — Z0000 Encounter for general adult medical examination without abnormal findings: Secondary | ICD-10-CM | POA: Diagnosis not present

## 2017-11-25 DIAGNOSIS — Z7251 High risk heterosexual behavior: Secondary | ICD-10-CM | POA: Diagnosis not present

## 2017-12-04 DIAGNOSIS — B009 Herpesviral infection, unspecified: Secondary | ICD-10-CM | POA: Diagnosis not present

## 2017-12-04 DIAGNOSIS — E785 Hyperlipidemia, unspecified: Secondary | ICD-10-CM | POA: Diagnosis not present

## 2018-03-12 ENCOUNTER — Encounter (HOSPITAL_BASED_OUTPATIENT_CLINIC_OR_DEPARTMENT_OTHER): Payer: Self-pay | Admitting: Emergency Medicine

## 2018-03-12 ENCOUNTER — Other Ambulatory Visit: Payer: Self-pay

## 2018-03-12 DIAGNOSIS — E876 Hypokalemia: Secondary | ICD-10-CM | POA: Insufficient documentation

## 2018-03-12 DIAGNOSIS — R002 Palpitations: Secondary | ICD-10-CM | POA: Diagnosis not present

## 2018-03-12 DIAGNOSIS — Z79899 Other long term (current) drug therapy: Secondary | ICD-10-CM | POA: Diagnosis not present

## 2018-03-12 DIAGNOSIS — R7989 Other specified abnormal findings of blood chemistry: Secondary | ICD-10-CM | POA: Diagnosis not present

## 2018-03-12 NOTE — ED Triage Notes (Signed)
Pt states he is having heart palpitations off and on today   Denies chest pain

## 2018-03-13 ENCOUNTER — Emergency Department (HOSPITAL_BASED_OUTPATIENT_CLINIC_OR_DEPARTMENT_OTHER)
Admission: EM | Admit: 2018-03-13 | Discharge: 2018-03-13 | Disposition: A | Discharge status: Home / Self Care | Payer: BLUE CROSS/BLUE SHIELD | Attending: Emergency Medicine | Admitting: Emergency Medicine

## 2018-03-13 ENCOUNTER — Emergency Department (HOSPITAL_BASED_OUTPATIENT_CLINIC_OR_DEPARTMENT_OTHER): Payer: BLUE CROSS/BLUE SHIELD

## 2018-03-13 DIAGNOSIS — R7989 Other specified abnormal findings of blood chemistry: Secondary | ICD-10-CM

## 2018-03-13 DIAGNOSIS — R002 Palpitations: Secondary | ICD-10-CM

## 2018-03-13 DIAGNOSIS — E876 Hypokalemia: Secondary | ICD-10-CM

## 2018-03-13 LAB — BASIC METABOLIC PANEL
Anion gap: 9 (ref 5–15)
BUN: 26 mg/dL — ABNORMAL HIGH (ref 6–20)
CALCIUM: 9 mg/dL (ref 8.9–10.3)
CO2: 22 mmol/L (ref 22–32)
CREATININE: 1.62 mg/dL — AB (ref 0.61–1.24)
Chloride: 105 mmol/L (ref 98–111)
GFR calc Af Amer: >60 mL/min (ref >60)
GFR calc non Af Amer: 52 mL/min — ABNORMAL LOW (ref >60)
GLUCOSE: 114 mg/dL — AB (ref 70–99)
Potassium: 3.3 mmol/L — ABNORMAL LOW (ref 3.5–5.1)
Sodium: 136 mmol/L (ref 135–145)

## 2018-03-13 LAB — CBC
HEMATOCRIT: 44.9 % (ref 39.0–52.0)
Hemoglobin: 14.9 g/dL (ref 13.0–17.0)
MCH: 29.3 pg (ref 26.0–34.0)
MCHC: 33.2 g/dL (ref 30.0–36.0)
MCV: 88.2 fL (ref 80.0–100.0)
PLATELETS: 226 10*3/uL (ref 150–400)
RBC: 5.09 MIL/uL (ref 4.22–5.81)
RDW: 13.3 % (ref 11.5–15.5)
WBC: 7.8 10*3/uL (ref 4.0–10.5)
nRBC: 0 % (ref 0.0–0.2)

## 2018-03-13 LAB — TROPONIN I: Troponin I: <0.03 ng/mL (ref <0.03)

## 2018-03-13 IMAGING — DX DG CHEST 2V
2 series · 2 of 2 positions shown · non-contrast
Comparison: None.

CLINICAL DATA: Palpitations.

EXAM:
CHEST - 2 VIEW

[chest pa]
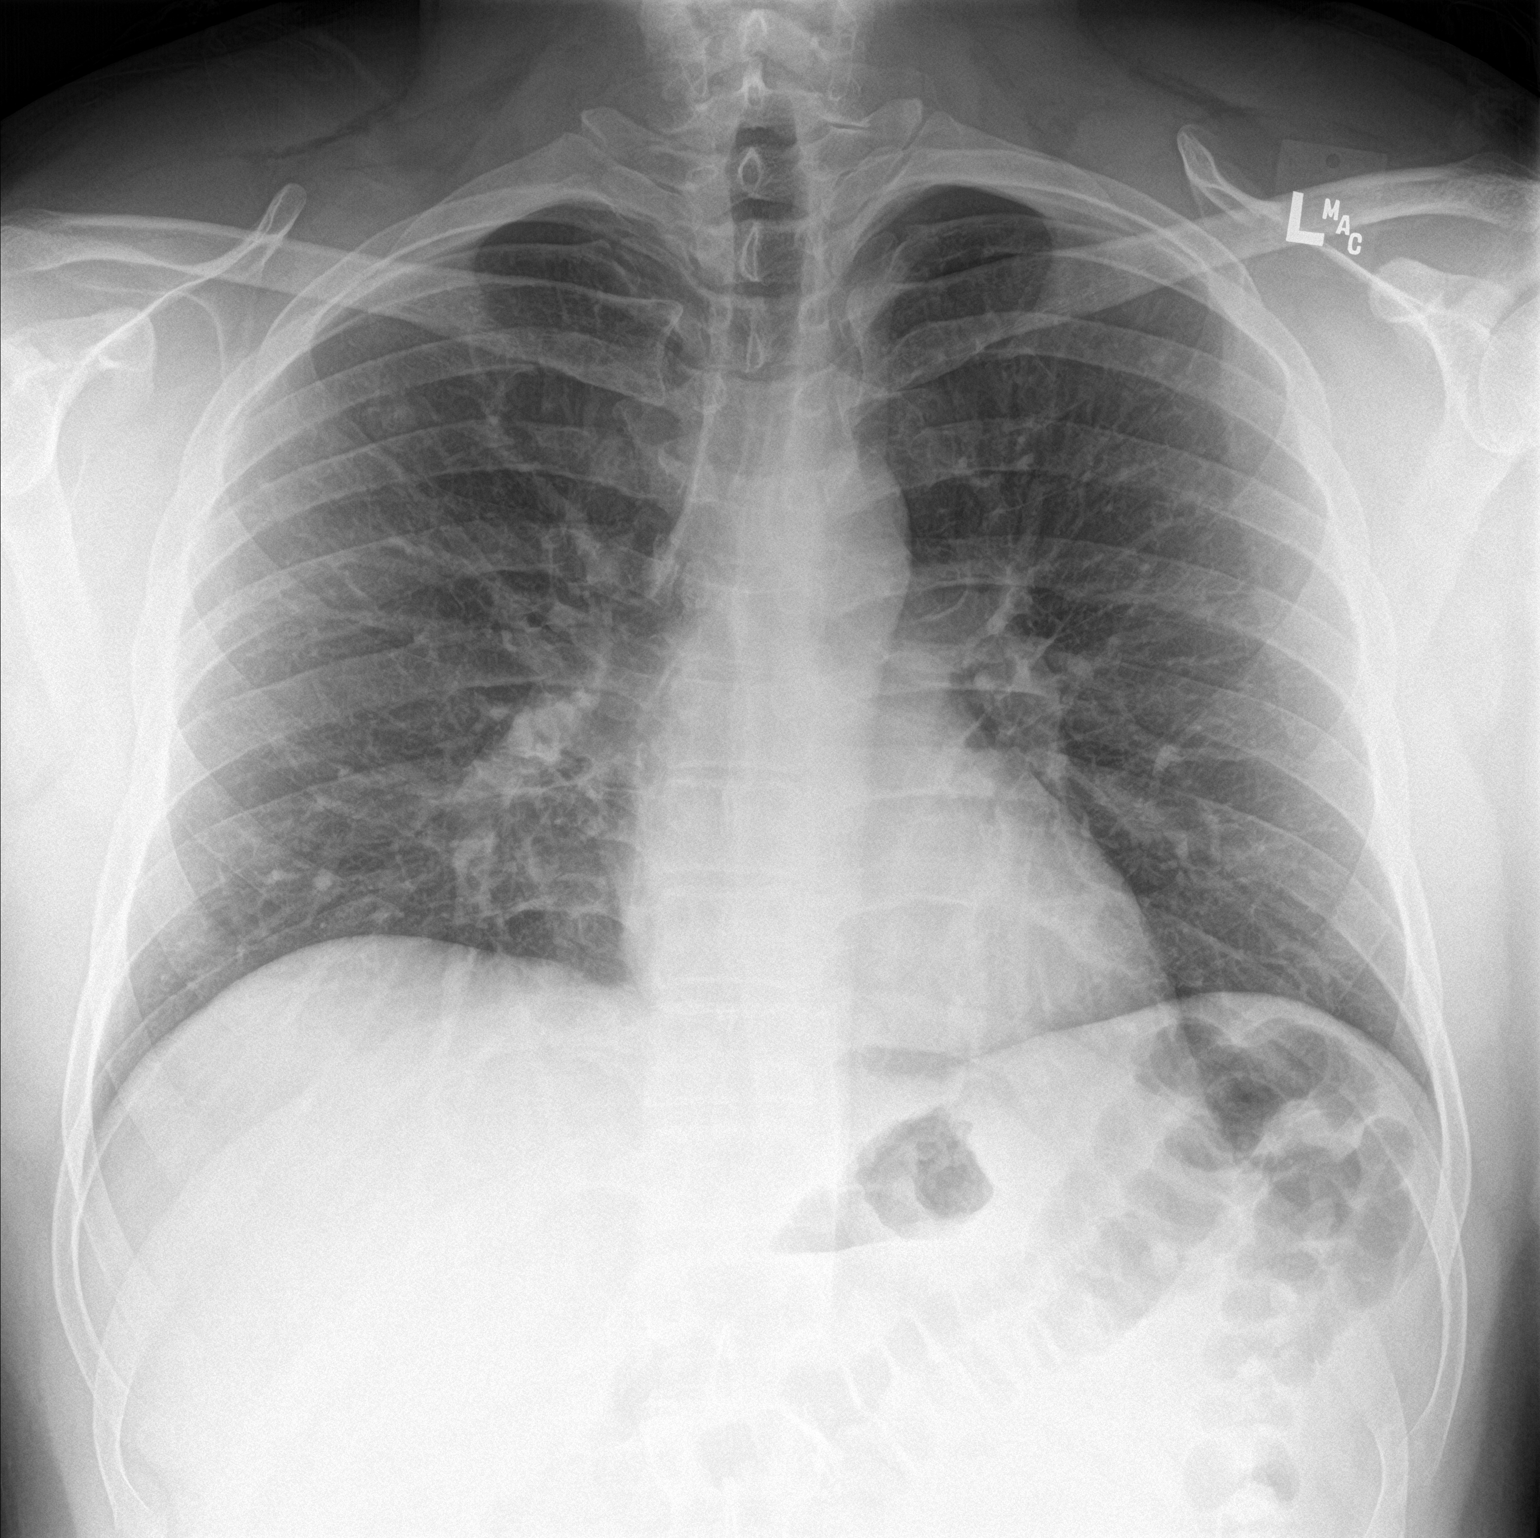

[chest lat]
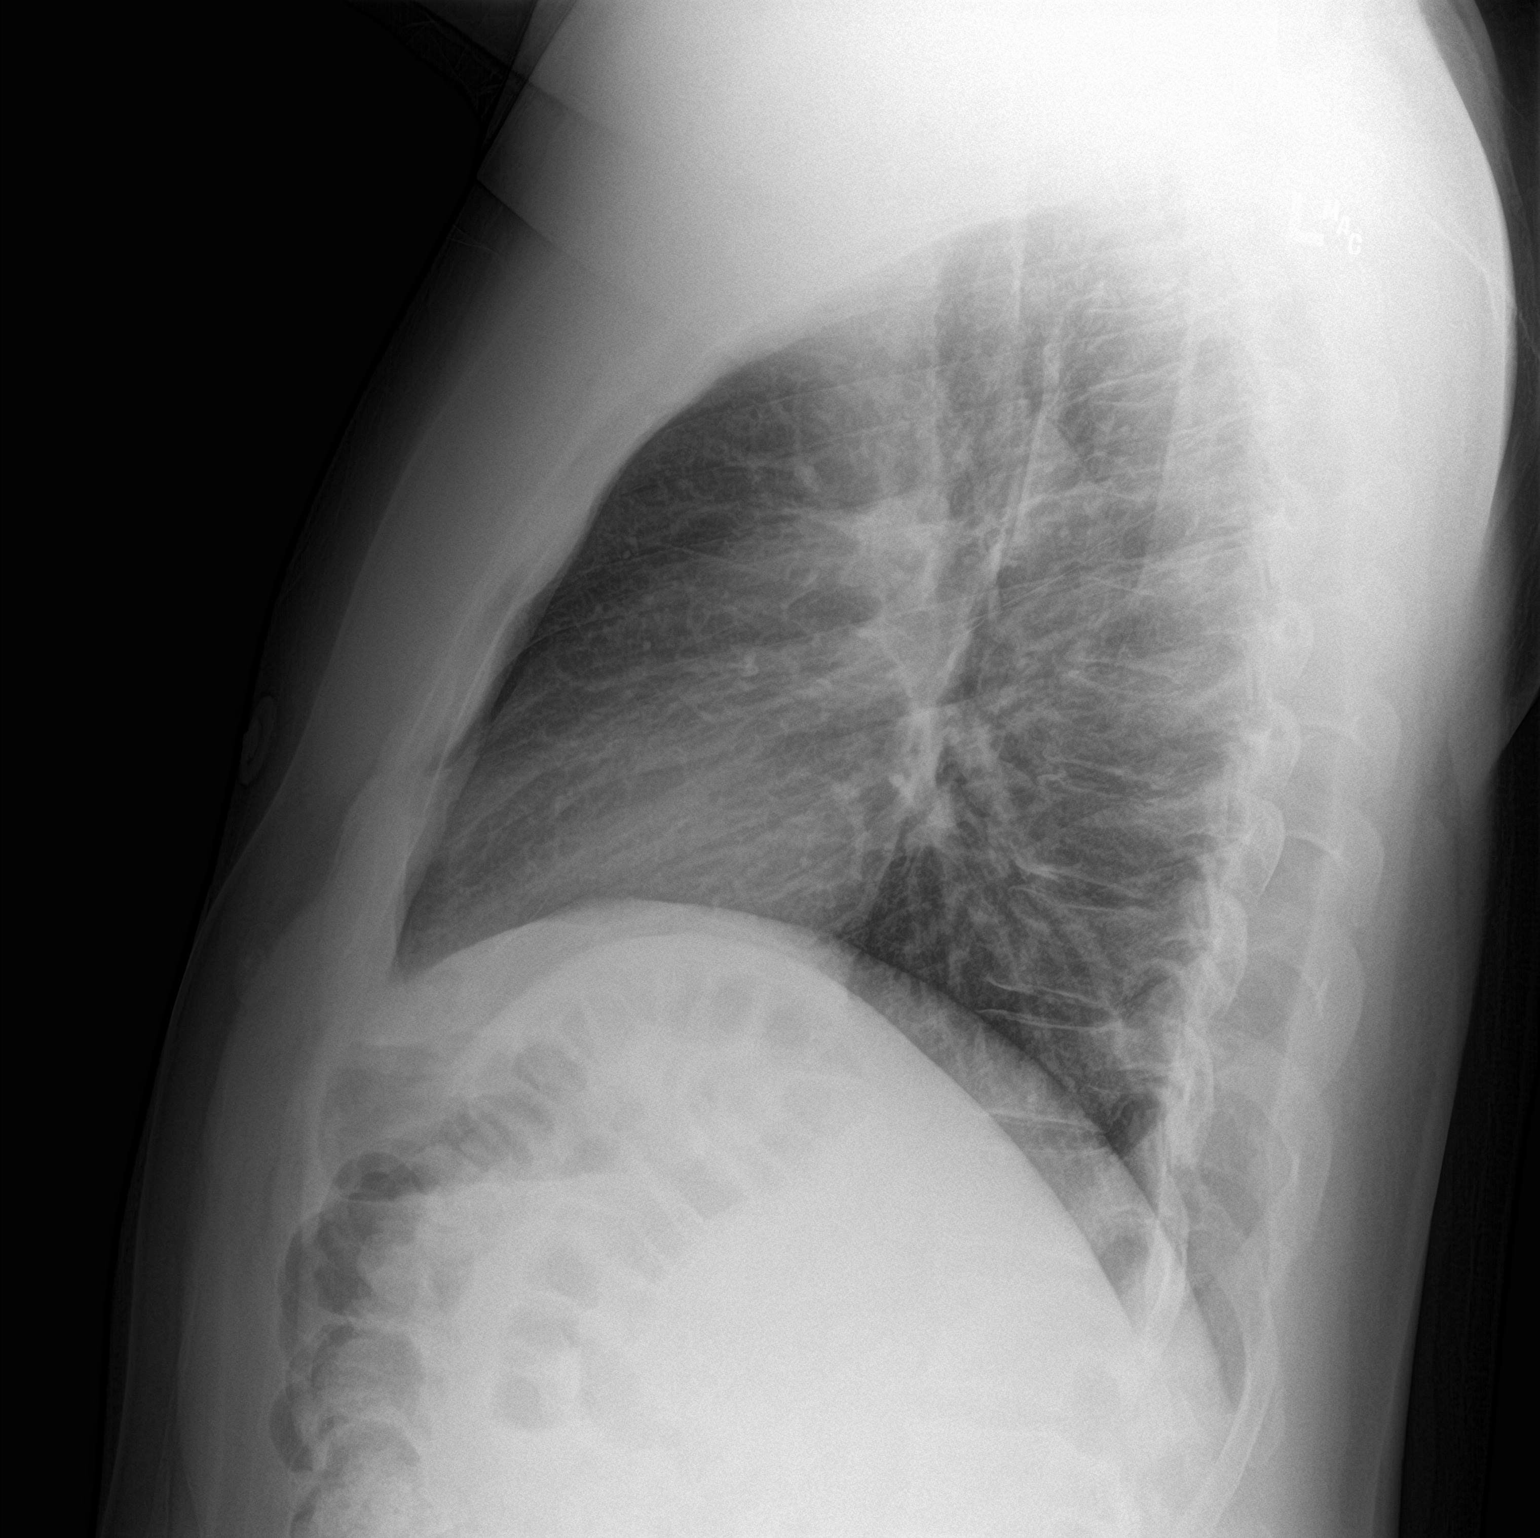

[2 of 2 positions shown; findings below may reference images not displayed]

FINDINGS: The cardiomediastinal contours are normal. The lungs are clear.
Pulmonary vasculature is normal. No consolidation, pleural effusion,
or pneumothorax. No acute osseous abnormalities are seen.
IMPRESSION: No acute pulmonary process.

## 2018-03-13 NOTE — ED Provider Notes (Signed)
[facility] EMERGENCY DEPARTMENT Provider Note  CSN: [id number] Arrival date & time: 03/12/18 2253  Chief Complaint(s) Palpitations  HPI Steve Wolfe is a 37 y.o. male   The history is provided by the patient.  Palpitations   This is a recurrent problem. The current episode started 12 to 24 hours ago. Episode frequency: 2 episodes approx 20 hours ago. The problem has been resolved. Pertinent negatives include no diaphoresis, no fever, no malaise/fatigue, no chest pain, no chest pressure, no exertional chest pressure, no nausea, no vomiting, no leg pain, no lower extremity edema and no shortness of breath. He has tried nothing for the symptoms.    Drinks coffee and started taking preworkout drink. Drank etoh all weekend.  Past Medical History History reviewed. No pertinent past medical history. Patient Active Problem List   Diagnosis Date Noted  . Cellulitis of right elbow 05/25/2017   Home Medication(s) Prior to Admission medications   Medication Sig Start Date End Date Taking? Authorizing Provider  cephALEXin (KEFLEX) 500 MG capsule Take 1 capsule (500 mg total) by mouth 4 (four) times daily. 05/24/17   Hudnall, Azucena Fallen, MD  HYDROcodone-acetaminophen (NORCO) 5-325 MG tablet Take 1 tablet by mouth every 6 (six) hours as needed for moderate pain. 05/24/17   Hudnall, Azucena Fallen, MD  ibuprofen (ADVIL,MOTRIN) 800 MG tablet Take 1 tablet (800 mg total) by mouth 3 (three) times daily. Patient taking differently: Take 800 mg by mouth every 8 (eight) hours as needed for fever, mild pain or moderate pain.  05/25/17   Palumbo, April, MD  sulfamethoxazole-trimethoprim (BACTRIM DS,SEPTRA DS) 800-160 MG tablet Take 2 tablets by mouth 2 (two) times daily. 05/30/17   Lenda Kelp, MD                                                                                                                                    Past Surgical History History reviewed. No pertinent surgical history. Family  History History reviewed. No pertinent family history.  Social History Social History   Tobacco Use  . Smoking status: Never Smoker  . Smokeless tobacco: Never Used  Substance Use Topics  . Alcohol use: Yes    Comment: occ  . Drug use: Never   Allergies Patient has no known allergies.  Review of Systems Review of Systems  Constitutional: Negative for diaphoresis, fever and malaise/fatigue.  Respiratory: Negative for shortness of breath.   Cardiovascular: Positive for palpitations. Negative for chest pain.  Gastrointestinal: Negative for nausea and vomiting.   All other systems are reviewed and are negative for acute change except as noted in the HPI  Physical Exam Vital Signs  I have reviewed the triage vital signs BP (!) 126/91   Pulse 73   Temp 98.7 F (37.1 C) (Oral)   Resp (!) 22   SpO2 97%   Physical Exam  Constitutional: He is oriented to person, place, and time. He  appears well-developed and well-nourished. No distress.  HENT:  Head: Normocephalic and atraumatic.  Nose: Nose normal.  Eyes: Pupils are equal, round, and reactive to light. Conjunctivae and EOM are normal. Right eye exhibits no discharge. Left eye exhibits no discharge. No scleral icterus.  Neck: Normal range of motion. Neck supple.  Cardiovascular: Normal rate and regular rhythm. Exam reveals no gallop and no friction rub.  No murmur heard. Pulmonary/Chest: Effort normal and breath sounds normal. No stridor. No respiratory distress. He has no rales.  Abdominal: Soft. He exhibits no distension. There is no tenderness.  Musculoskeletal: He exhibits no edema or tenderness.  Neurological: He is alert and oriented to person, place, and time.  Skin: Skin is warm and dry. No rash noted. He is not diaphoretic. No erythema.  Psychiatric: He has a normal mood and affect.  Vitals reviewed.   ED Results and Treatments Labs (all labs ordered are listed, but only abnormal results are displayed) Labs  Reviewed  BASIC METABOLIC PANEL - Abnormal; Notable for the following components:      Result Value   Potassium 3.3 (*)    Glucose, Bld 114 (*)    BUN 26 (*)    Creatinine, Ser 1.62 (*)    GFR calc non Af Amer 52 (*)    All other components within normal limits  CBC  TROPONIN I                                                                                                                         EKG  EKG Interpretation  Date/Time:  Monday March 12 2018 23:00:11 EST Ventricular Rate:  83 PR Interval:  172 QRS Duration: 88 QT Interval:  354 QTC Calculation: 415 R Axis:   108 Text Interpretation:  Normal sinus rhythm Rightward axis Borderline ECG no stemi No old tracing to compare Confirmed by Drema Pryardama, Pedro [phone]) on 03/13/2018 1:45:53 AM      Radiology Dg Chest 2 View  Result Date: 03/13/2018 CLINICAL DATA:  Palpitations. EXAM: CHEST - 2 VIEW COMPARISON:  None. FINDINGS: The cardiomediastinal contours are normal. The lungs are clear. Pulmonary vasculature is normal. No consolidation, pleural effusion, or pneumothorax. No acute osseous abnormalities are seen. IMPRESSION: No acute pulmonary process. Electronically Signed   By: Narda RutherfordMelanie  Sanford M.D.   On: 03/13/2018 02:06   Pertinent labs & imaging results that were available during my care of the patient were reviewed by me and considered in my medical decision making (see chart for details).  Medications Ordered in ED Medications - No data to display  Procedures Procedures  (including critical care time)  Medical Decision Making / ED Course I have reviewed the nursing notes for this encounter and the patient's prior records (if available in EHR or on provided paperwork).    Intermittent sporadic episodes of palpitations.  Last occurred approximately 20 hours ago.  This was in the setting of  several days of alcohol consumption as well as daily caffeine use and also use of pre-workout supplementation.   EKG reassuring without significant dysrhythmias or blocks.   CBC without anemia.  Metabolic panel notable for mild hypokalemia and elevated creatinine which may be related to dehydration or workout supplements.  Patient did not have any symptoms concerning for ACS or PE.  Instructed to stop using workout supplements and follow-up with his primary care provider to recheck his kidney function.  The patient appears reasonably screened and/or stabilized for discharge and I doubt any other medical condition or other [facility] requiring further screening, evaluation, or treatment in the ED at this time prior to discharge.  The patient is safe for discharge with strict return precautions.   Final Clinical Impression(s) / ED Diagnoses Final diagnoses:  Palpitations  Hypokalemia  Creatinine elevation   Disposition: Discharge  Condition: Good  I have discussed the results, Dx and Tx plan with the patient who expressed understanding and agree(s) with the plan. Discharge instructions discussed at great length. The patient was given strict return precautions who verbalized understanding of the instructions. No further questions at time of discharge.    ED Discharge Orders    None       Follow Up: Primary care provider   In 1 to 2 weeks to recheck your renal function      This chart was dictated using voice recognition software.  Despite best efforts to proofread,  errors can occur which can change the documentation meaning.   Nira Conn, MD 03/13/18 [phone]

## 2018-03-13 NOTE — Discharge Instructions (Addendum)
Stop using any workout supplements until you see your primary care provider to recheck your kidney function.

## 2018-03-13 NOTE — ED Notes (Signed)
Pt verbalizes understanding of d/c instructions and denies any further needs at this time. 

## 2018-03-13 NOTE — ED Notes (Signed)
Pt c/o palpitations twice this morning at 0430. Denies chest pain, states he has been taking workout supplement the last few days.

## 2018-03-30 DIAGNOSIS — M545 Low back pain: Secondary | ICD-10-CM | POA: Diagnosis not present

## 2018-03-30 DIAGNOSIS — N179 Acute kidney failure, unspecified: Secondary | ICD-10-CM | POA: Diagnosis not present

## 2018-09-26 DIAGNOSIS — Z20828 Contact with and (suspected) exposure to other viral communicable diseases: Secondary | ICD-10-CM | POA: Diagnosis not present

## 2018-11-05 DIAGNOSIS — Z20828 Contact with and (suspected) exposure to other viral communicable diseases: Secondary | ICD-10-CM | POA: Diagnosis not present

## 2018-11-05 DIAGNOSIS — R05 Cough: Secondary | ICD-10-CM | POA: Diagnosis not present

## 2018-11-05 DIAGNOSIS — R509 Fever, unspecified: Secondary | ICD-10-CM | POA: Diagnosis not present

## 2018-11-05 DIAGNOSIS — M791 Myalgia, unspecified site: Secondary | ICD-10-CM | POA: Diagnosis not present

## 2019-09-07 ENCOUNTER — Emergency Department (HOSPITAL_BASED_OUTPATIENT_CLINIC_OR_DEPARTMENT_OTHER)
Admission: EM | Admit: 2019-09-07 | Discharge: 2019-09-07 | Disposition: A | Discharge status: Home / Self Care | Payer: BLUE CROSS/BLUE SHIELD | Attending: Emergency Medicine | Admitting: Emergency Medicine

## 2019-09-07 ENCOUNTER — Other Ambulatory Visit: Payer: Self-pay

## 2019-09-07 ENCOUNTER — Encounter (HOSPITAL_BASED_OUTPATIENT_CLINIC_OR_DEPARTMENT_OTHER): Payer: Self-pay | Admitting: Emergency Medicine

## 2019-09-07 DIAGNOSIS — R030 Elevated blood-pressure reading, without diagnosis of hypertension: Secondary | ICD-10-CM | POA: Insufficient documentation

## 2019-09-07 DIAGNOSIS — R519 Headache, unspecified: Secondary | ICD-10-CM

## 2019-09-07 NOTE — ED Provider Notes (Signed)
- [facility] EMERGENCY DEPARTMENT Provider Note  CSN: [id number] Arrival date & time: 09/07/19 0152  Chief Complaint(s) Hypertension and Headache  HPI Steve Wolfe is a 39 y.o. male   HPI CC: headache  Onset/Duration: 1 week Timing: intermittent. No pain at this time Location: generalized Quality: aching Severity: mild to moderate Modifying Factors:  Improved by: Sporadically improved  Worsened by: Nothing Associated Signs/Symptoms:  Pertinent (+): Fatigue, dizziness  Pertinent (-): Focal deficits, visual disturbance, nausea, vomiting, chest pain, shortness of breath, abdominal pain. Context: Patient noted elevated blood pressure today reporting 130s/120s.  Past Medical History History reviewed. No pertinent past medical history. Patient Active Problem List   Diagnosis Date Noted  . Cellulitis of right elbow 05/25/2017   Home Medication(s) Prior to Admission medications   Medication Sig Start Date End Date Taking? Authorizing Provider  cephALEXin (KEFLEX) 500 MG capsule Take 1 capsule (500 mg total) by mouth 4 (four) times daily. 05/24/17   Hudnall, Azucena Fallen, MD  HYDROcodone-acetaminophen (NORCO) 5-325 MG tablet Take 1 tablet by mouth every 6 (six) hours as needed for moderate pain. 05/24/17   Hudnall, Azucena Fallen, MD  ibuprofen (ADVIL,MOTRIN) 800 MG tablet Take 1 tablet (800 mg total) by mouth 3 (three) times daily. Patient taking differently: Take 800 mg by mouth every 8 (eight) hours as needed for fever, mild pain or moderate pain.  05/25/17   Palumbo, April, MD  sulfamethoxazole-trimethoprim (BACTRIM DS,SEPTRA DS) 800-160 MG tablet Take 2 tablets by mouth 2 (two) times daily. 05/30/17   Lenda Kelp, MD                                                                                                                                    Past Surgical History History reviewed. No pertinent surgical history. Family History No family history on file.  Social History Social  History   Tobacco Use  . Smoking status: Never Smoker  . Smokeless tobacco: Never Used  Substance Use Topics  . Alcohol use: Yes    Comment: occ  . Drug use: Never   Allergies Patient has no known allergies.  Review of Systems Review of Systems All other systems are reviewed and are negative for acute change except as noted in the HPI  Physical Exam Vital Signs  I have reviewed the triage vital signs BP (!) 133/98   Pulse 66   Temp 98.5 F (36.9 C) (Oral)   Resp 20   Ht 5\' 11"  (1.803 m)   Wt 115.9 kg   SpO2 98%   BMI 35.64 kg/m   Physical Exam Vitals reviewed.  Constitutional:      General: He is not in acute distress.    Appearance: He is well-developed. He is not diaphoretic.  HENT:     Head: Normocephalic and atraumatic.     Nose: Nose normal.  Eyes:     General: No scleral icterus.  Right eye: No discharge.        Left eye: No discharge.     Conjunctiva/sclera: Conjunctivae normal.     Pupils: Pupils are equal, round, and reactive to light.  Cardiovascular:     Rate and Rhythm: Normal rate and regular rhythm.     Heart sounds: No murmur. No friction rub. No gallop.   Pulmonary:     Effort: Pulmonary effort is normal. No respiratory distress.     Breath sounds: Normal breath sounds. No stridor. No rales.  Abdominal:     General: There is no distension.     Palpations: Abdomen is soft.     Tenderness: There is no abdominal tenderness.  Musculoskeletal:        General: No tenderness.     Cervical back: Normal range of motion and neck supple.  Skin:    General: Skin is warm and dry.     Findings: No erythema or rash.  Neurological:     Mental Status: He is alert and oriented to person, place, and time.     ED Results and Treatments Labs (all labs ordered are listed, but only abnormal results are displayed) Labs Reviewed - No data to display                                                                                                                        EKG  EKG Interpretation  Date/Time:  Saturday Sep 07 2019 02:19:26 EDT Ventricular Rate:  74 PR Interval:  168 QRS Duration: 88 QT Interval:  356 QTC Calculation: 395 R Axis:   104 Text Interpretation: Normal sinus rhythm Rightward axis Borderline ECG No significant change since last tracing Confirmed by Drema Pry [phone]) on 09/07/2019 2:57:35 AM      Radiology No results found.  Pertinent labs & imaging results that were available during my care of the patient were reviewed by me and considered in my medical decision making (see chart for details).  Medications Ordered in ED Medications - No data to display                                                                                                                                  Procedures Procedures  (including critical care time)  Medical Decision Making / ED Course I have reviewed the nursing notes for this encounter and the patient's prior records (if available in EHR or  on provided paperwork).   [facility] was evaluated in Emergency Department on 09/07/2019 for the symptoms described in the history of present illness. He was evaluated in the context of the global COVID-19 pandemic, which necessitated consideration that the patient might be at risk for infection with the SARS-CoV-2 virus that causes COVID-19. Institutional protocols and algorithms that pertain to the evaluation of patients at risk for COVID-19 are in a state of rapid change based on information released by regulatory bodies including the CDC and federal and state organizations. These policies and algorithms were followed during the patient's care in the ED.  Patient presents with intermittent headaches, fatigue and dizziness.  Currently asymptomatic.  Patient was mostly concerned due to his elevated blood pressure. Blood pressures here have been mildly elevated blood reassuring.  EKG without acute ischemic changes.  No evidence of endorgan  damage requiring further work-up or imaging at this time.        Final Clinical Impression(s) / ED Diagnoses Final diagnoses:  Elevated blood pressure reading  Intermittent headache   The patient appears reasonably screened and/or stabilized for discharge and I doubt any other medical condition or other [facility] requiring further screening, evaluation, or treatment in the ED at this time prior to discharge. Safe for discharge with strict return precautions.  Disposition: Discharge  Condition: Good  I have discussed the results, Dx and Tx plan with the patient/family who expressed understanding and agree(s) with the plan. Discharge instructions discussed at length. The patient/family was given strict return precautions who verbalized understanding of the instructions. No further questions at time of discharge.    ED Discharge Orders    None        Follow Up: Pc, [facility] [place] [id number] [phone]  Schedule an appointment as soon as possible for a visit        This chart was dictated using voice recognition software.  Despite best efforts to proofread,  errors can occur which can change the documentation meaning.   Fatima Blank, MD 09/07/19 [phone]

## 2019-09-07 NOTE — ED Triage Notes (Addendum)
Headache and fatigue last few days, took b/p at home and statess it was "high". No hx of HTN. Denies sob or chest pain. PT had a few episodes of feeling light headed pta. AMB. NAD steady gait.

## 2020-09-01 ENCOUNTER — Other Ambulatory Visit: Payer: Self-pay

## 2020-09-01 ENCOUNTER — Emergency Department (HOSPITAL_BASED_OUTPATIENT_CLINIC_OR_DEPARTMENT_OTHER): Payer: BC Managed Care – PPO

## 2020-09-01 ENCOUNTER — Emergency Department (HOSPITAL_BASED_OUTPATIENT_CLINIC_OR_DEPARTMENT_OTHER)
Admission: EM | Admit: 2020-09-01 | Discharge: 2020-09-02 | Payer: BC Managed Care – PPO | Attending: Emergency Medicine | Admitting: Emergency Medicine

## 2020-09-01 ENCOUNTER — Encounter (HOSPITAL_BASED_OUTPATIENT_CLINIC_OR_DEPARTMENT_OTHER): Payer: Self-pay | Admitting: *Deleted

## 2020-09-01 DIAGNOSIS — W01198A Fall on same level from slipping, tripping and stumbling with subsequent striking against other object, initial encounter: Secondary | ICD-10-CM | POA: Insufficient documentation

## 2020-09-01 DIAGNOSIS — S0990XA Unspecified injury of head, initial encounter: Secondary | ICD-10-CM

## 2020-09-01 DIAGNOSIS — Z20822 Contact with and (suspected) exposure to covid-19: Secondary | ICD-10-CM | POA: Diagnosis not present

## 2020-09-01 DIAGNOSIS — S0101XA Laceration without foreign body of scalp, initial encounter: Secondary | ICD-10-CM | POA: Insufficient documentation

## 2020-09-01 DIAGNOSIS — Z8673 Personal history of transient ischemic attack (TIA), and cerebral infarction without residual deficits: Secondary | ICD-10-CM

## 2020-09-01 LAB — CBC WITH DIFFERENTIAL/PLATELET
Abs Immature Granulocytes: 0.02 10*3/uL (ref 0.00–0.07)
Basophils Absolute: 0 10*3/uL (ref 0.0–0.1)
Basophils Relative: 0 %
Eosinophils Absolute: 0.2 10*3/uL (ref 0.0–0.5)
Eosinophils Relative: 3 %
HCT: 44.5 % (ref 39.0–52.0)
Hemoglobin: 15.3 g/dL (ref 13.0–17.0)
Immature Granulocytes: 0 %
Lymphocytes Relative: 29 %
Lymphs Abs: 2.1 10*3/uL (ref 0.7–4.0)
MCH: 29.9 pg (ref 26.0–34.0)
MCHC: 34.4 g/dL (ref 30.0–36.0)
MCV: 87.1 fL (ref 80.0–100.0)
Monocytes Absolute: 0.5 10*3/uL (ref 0.1–1.0)
Monocytes Relative: 7 %
Neutro Abs: 4.3 10*3/uL (ref 1.7–7.7)
Neutrophils Relative %: 61 %
Platelets: 221 10*3/uL (ref 150–400)
RBC: 5.11 MIL/uL (ref 4.22–5.81)
RDW: 13.2 % (ref 11.5–15.5)
WBC: 7.1 10*3/uL (ref 4.0–10.5)
nRBC: 0 % (ref 0.0–0.2)

## 2020-09-01 LAB — RESP PANEL BY RT-PCR (FLU A&B, COVID) ARPGX2
Influenza A by PCR: NEGATIVE
Influenza B by PCR: NEGATIVE
SARS Coronavirus 2 by RT PCR: NEGATIVE

## 2020-09-01 LAB — BASIC METABOLIC PANEL
Anion gap: 8 (ref 5–15)
BUN: 12 mg/dL (ref 6–20)
CO2: 24 mmol/L (ref 22–32)
Calcium: 9.2 mg/dL (ref 8.9–10.3)
Chloride: 103 mmol/L (ref 98–111)
Creatinine, Ser: 1.13 mg/dL (ref 0.61–1.24)
GFR, Estimated: >60 mL/min (ref >60)
Glucose, Bld: 123 mg/dL — ABNORMAL HIGH (ref 70–99)
Potassium: 4 mmol/L (ref 3.5–5.1)
Sodium: 135 mmol/L (ref 135–145)

## 2020-09-01 IMAGING — CT CT HEAD W/O CM
4 series · 16 of 47 positions shown, 18 images · non-contrast
Comparison: None.

CLINICAL DATA: Head trauma

EXAM:
CT HEAD WITHOUT CONTRAST
TECHNIQUE: Contiguous axial images were obtained from the base of the skull
through the vertex without intravenous contrast.

[Series 2: head wo · axial · 0.46mm/px · z∈[-149,-19]mm · 7 of 36 slices shown, 9 images]
[im 5/36  brain]
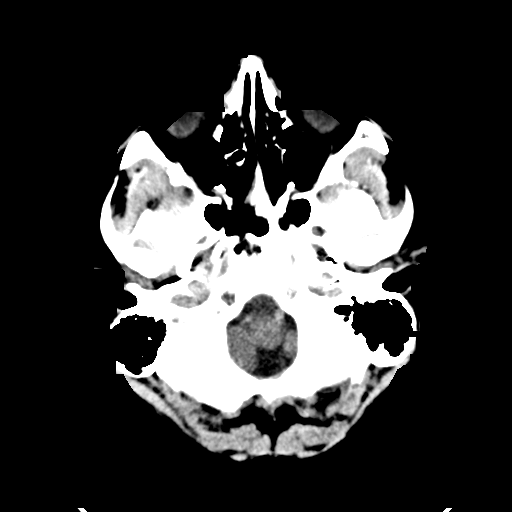
[im 5/36  bone]
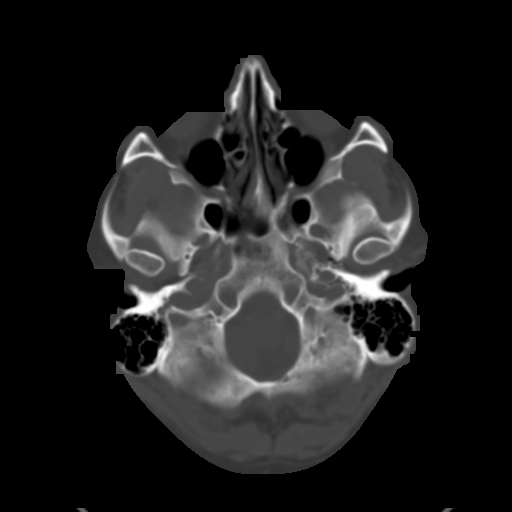
[im 9/36  brain]
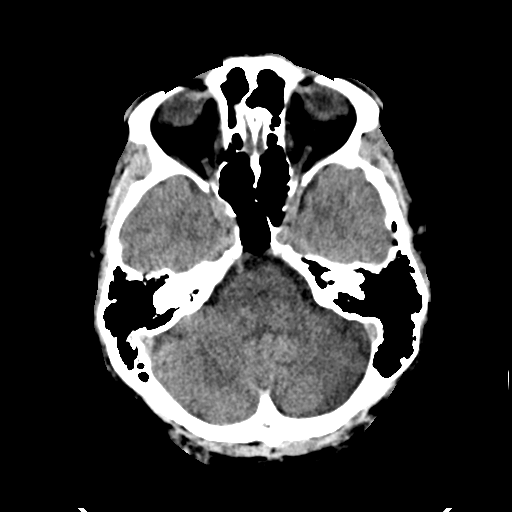
[im 14/36  brain]
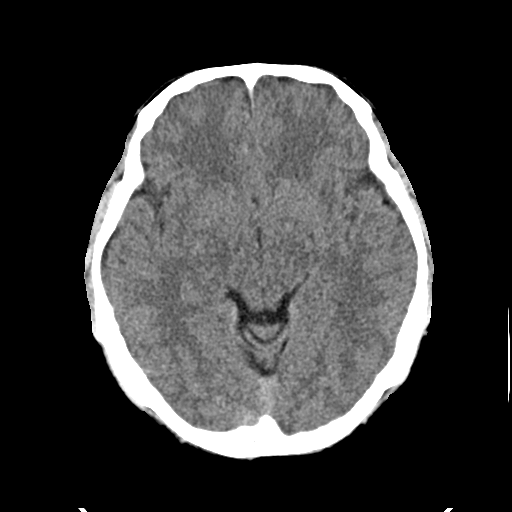
[im 18/36  brain]
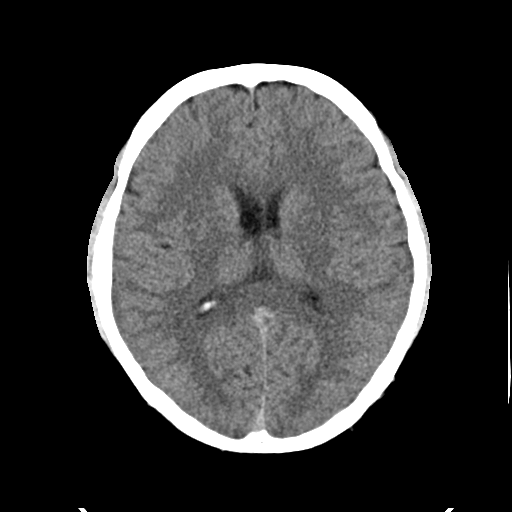
[im 22/36  brain]
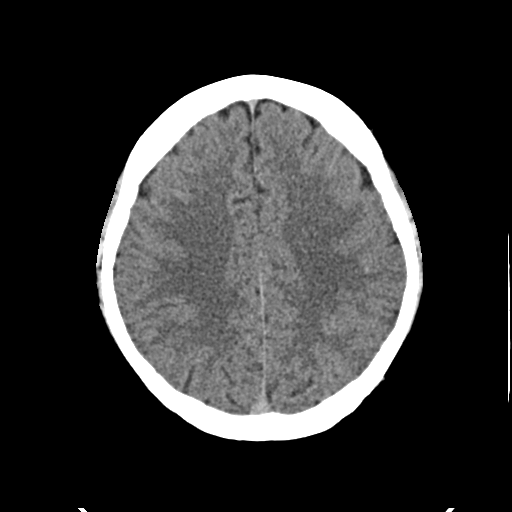
[im 22/36  bone]
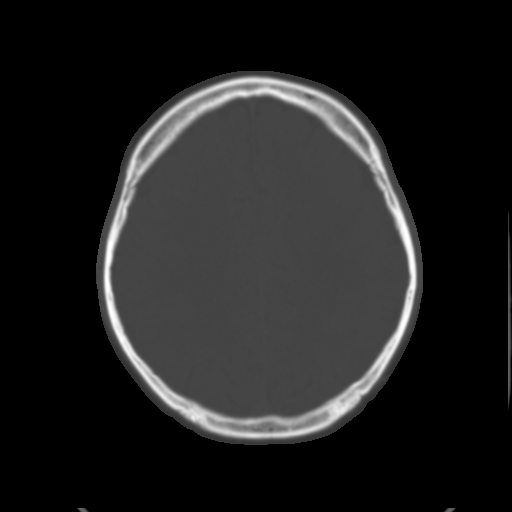
[im 27/36  brain]
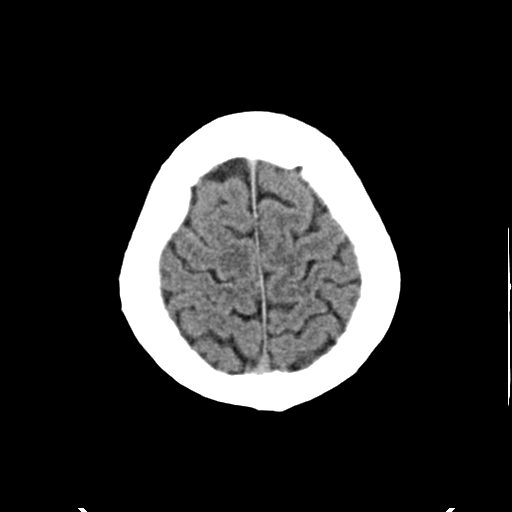
[im 31/36  brain]
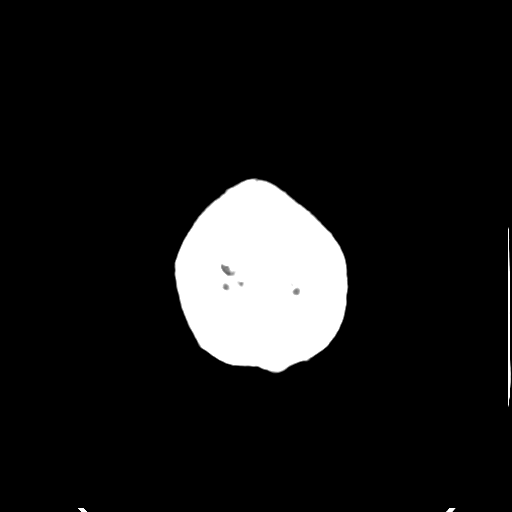

[Series 3: head bone · axial · 0.46mm/px · z∈[-153,-117]mm · 3 of 88 slices shown]
[im 9/88  bone]
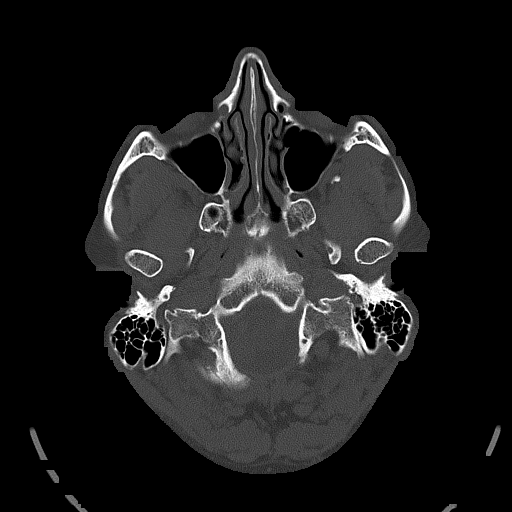
[im 18/88  bone]
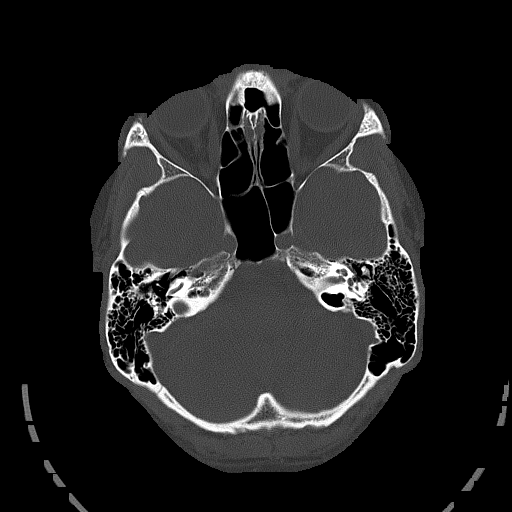
[im 27/88  bone]
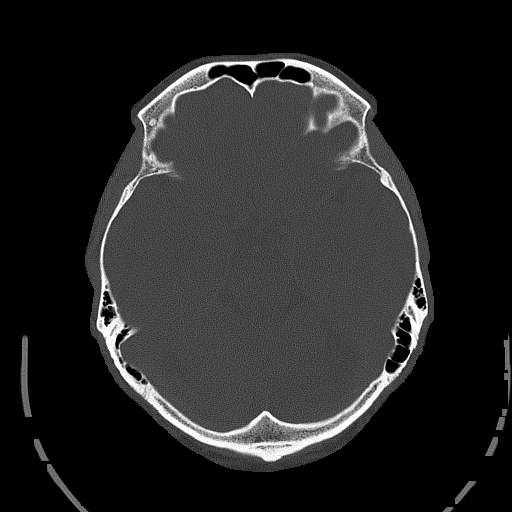

[Series 4: coronal soft · coronal · 0.34mm/px · 3 of 74 slices shown]
[im 25/74  brain]
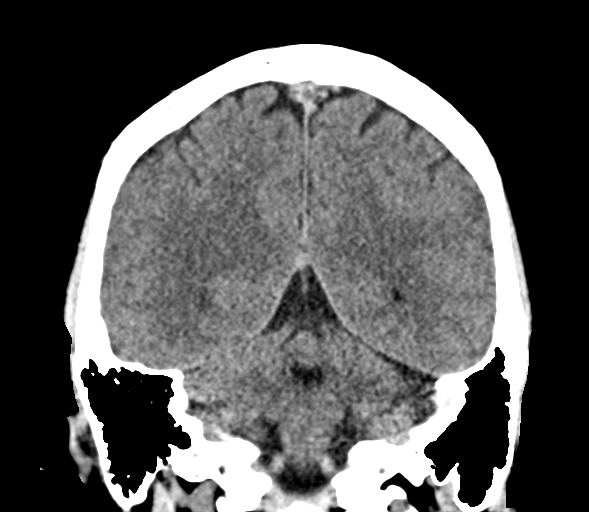
[im 33/74  brain]
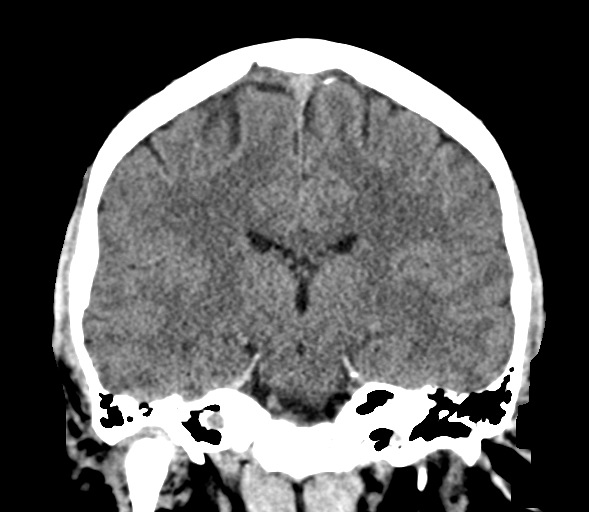
[im 41/74  brain]
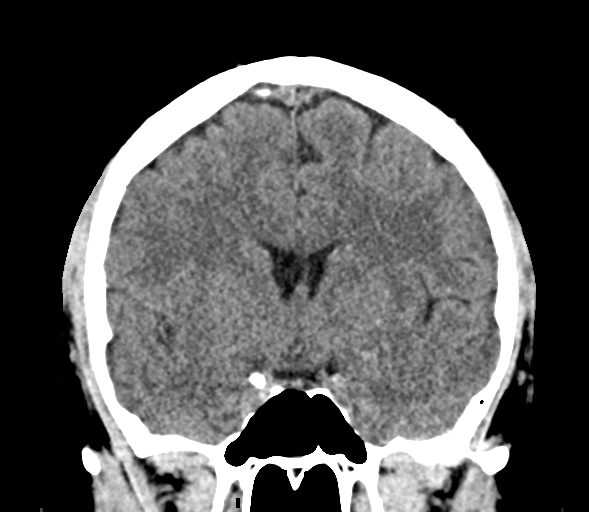

[Series 5: sag soft · sagittal · 0.34mm/px · 3 of 67 slices shown]
[im 23/67  brain]
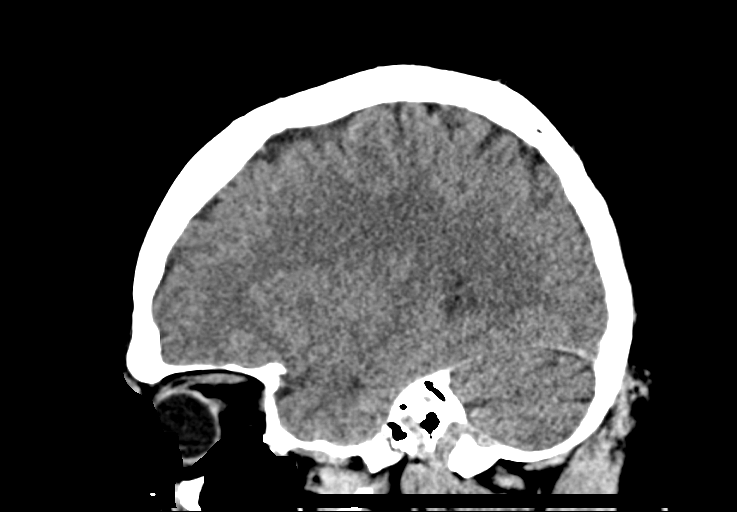
[im 34/67  brain]
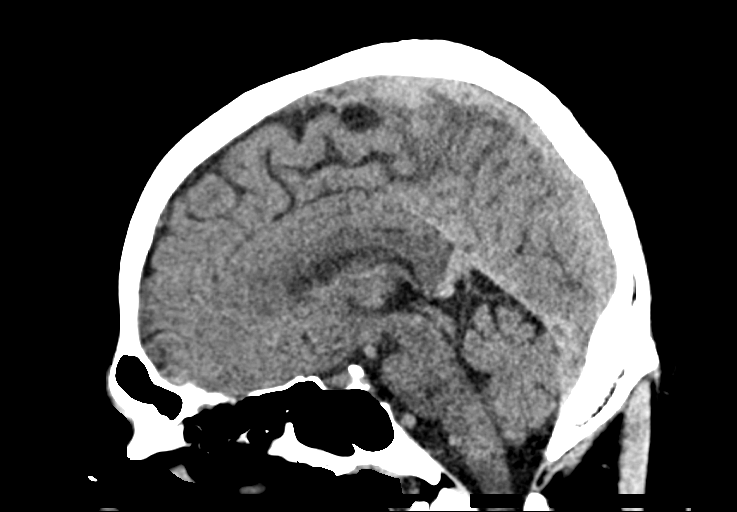
[im 45/67  brain]
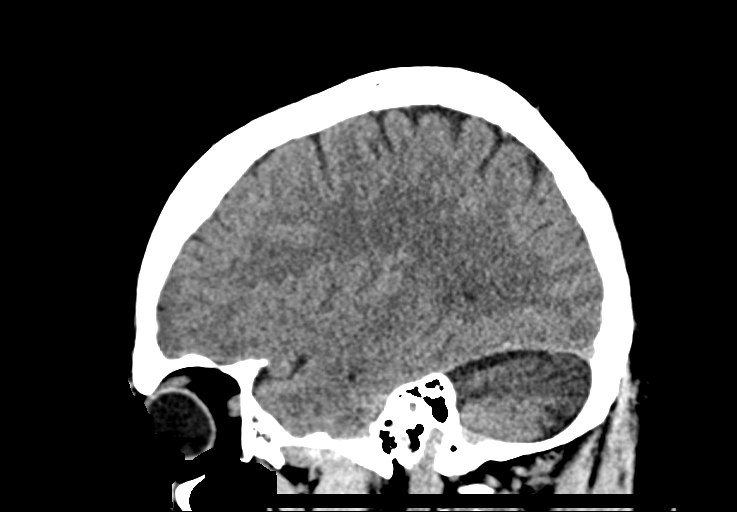

[16 of 47 positions shown; findings below may reference images not displayed]

FINDINGS: Brain: No hemorrhage or intracranial mass. Asymmetric hypodensity
within the left cerebellum, suspicious for acute or subacute
infarct. The ventricles are nonenlarged.

Vascular: No hyperdense vessels.  No unexpected calcification

Skull: Normal. Negative for fracture or focal lesion.

Sinuses/Orbits: No acute finding.

Other: None
IMPRESSION: 1. Asymmetric low-attenuation within the left cerebellum, appearance
is suspicious for acute to subacute infarct. Correlation with MRI is
recommended.
2. Negative for acute intracranial hemorrhage.

Critical Value/emergent results were called by telephone at the time
of interpretation on [DATE] at [DATE] to provider AMAZIGH ,
who verbally acknowledged these results.

## 2020-09-01 MED ORDER — LIDOCAINE HCL 2 % IJ SOLN
10.0000 mL | Freq: Once | INTRAMUSCULAR | Status: AC
  Administered 2020-09-01: 200 mg via INTRADERMAL
  Filled 2020-09-01: qty 20

## 2020-09-01 NOTE — ED Notes (Signed)
Patient transported to CT 

## 2020-09-01 NOTE — ED Provider Notes (Signed)
[facility] EMERGENCY DEPARTMENT Provider Note   CSN: [id number] Arrival date & time: 09/01/20  1957     History Chief Complaint  Patient presents with  . Laceration    Steve Wolfe is a 40 y.o. male.  HPI   Patient presents with head trauma. States he was removing a refrigerator and it fell on his head. He did not loose consciousness, but states he felt bleeding. He denies nausea or vomiting but endorses blurry vision to the right eye. He denies headaches. He is not on blood thinners. 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   History reviewed. No pertinent past medical history.  Patient Active Problem List   Diagnosis Date Noted  . Cellulitis of right elbow 05/25/2017    History reviewed. No pertinent surgical history.     No family history on file.  Social History   Tobacco Use  . Smoking status: Never Smoker  . Smokeless tobacco: Never Used  Vaping Use  . Vaping Use: Never used  Substance Use Topics  . Alcohol use: Yes    Comment: occ  . Drug use: Never    Home Medications Prior to Admission medications   Medication Sig Start Date End Date Taking? Authorizing Provider  cephALEXin (KEFLEX) 500 MG capsule Take 1 capsule (500 mg total) by mouth 4 (four) times daily. 05/24/17   Hudnall, Azucena Fallen, MD  HYDROcodone-acetaminophen (NORCO) 5-325 MG tablet Take 1 tablet by mouth every 6 (six) hours as needed for moderate pain. 05/24/17   Hudnall, Azucena Fallen, MD  ibuprofen (ADVIL,MOTRIN) 800 MG tablet Take 1 tablet (800 mg total) by mouth 3 (three) times daily. Patient taking differently: Take 800 mg by mouth every 8 (eight) hours as needed for fever, mild pain or moderate pain. 05/25/17   Palumbo, April, MD  sulfamethoxazole-trimethoprim (BACTRIM DS,SEPTRA DS) 800-160 MG tablet Take 2 tablets by mouth 2 (two) times daily. 05/30/17   Lenda Kelp, MD    Allergies    Patient has no known allergies.  Review of  Systems   Review of Systems  Constitutional: Negative for chills and fever.  HENT: Negative for ear pain and sore throat.   Eyes: Positive for visual disturbance. Negative for pain.  Respiratory: Negative for cough and shortness of breath.   Cardiovascular: Negative for chest pain and palpitations.  Gastrointestinal: Negative for abdominal pain and vomiting.  Genitourinary: Negative for dysuria and hematuria.  Musculoskeletal: Negative for arthralgias and back pain.  Skin: Negative for color change and rash.  Neurological: Negative for seizures and syncope.  All other systems reviewed and are negative.   Physical Exam Updated Vital Signs BP (!) 142/98 (BP Location: Left Arm)   Pulse 96   Temp 99 F (37.2 C) (Oral)   Resp 18   Ht 5\' 11"  (1.803 m)   Wt 115.7 kg   SpO2 98%   BMI 35.57 kg/m   Physical Exam Vitals and nursing note reviewed. Exam conducted with a chaperone present.  Constitutional:      Appearance: Normal appearance.     Comments: Sitting comfortably with bandage to the head.  HENT:     Head: Normocephalic.     Comments: Laceration to right side of scalp. Not actively bleeding.  Eyes:     General: No scleral icterus.       Right eye: No discharge.        Left eye: No discharge.  Extraocular Movements: Extraocular movements intact.     Pupils: Pupils are equal, round, and reactive to light.     Comments: EOMI. Right eye ptosis. PERRLA  Cardiovascular:     Rate and Rhythm: Normal rate and regular rhythm.     Pulses: Normal pulses.     Heart sounds: Normal heart sounds. No murmur heard. No friction rub. No gallop.   Pulmonary:     Effort: Pulmonary effort is normal. No respiratory distress.     Breath sounds: Normal breath sounds.  Abdominal:     General: Abdomen is flat. Bowel sounds are normal. There is no distension.     Palpations: Abdomen is soft.     Tenderness: There is no abdominal tenderness.  Musculoskeletal:     Cervical back: Normal range  of motion.  Skin:    General: Skin is warm and dry.     Coloration: Skin is not jaundiced.  Neurological:     General: No focal deficit present.     Mental Status: He is alert. Mental status is at baseline.     Cranial Nerves: No cranial nerve deficit.     Coordination: Coordination normal.     ED Results / Procedures / Treatments   Labs (all labs ordered are listed, but only abnormal results are displayed) Labs Reviewed  BASIC METABOLIC PANEL - Abnormal; Notable for the following components:      Result Value   Glucose, Bld 123 (*)    All other components within normal limits  RESP PANEL BY RT-PCR (FLU A&B, COVID) ARPGX2  CBC WITH DIFFERENTIAL/PLATELET    EKG None  Radiology CT Head Wo Contrast  Result Date: 09/01/2020 CLINICAL DATA:  Head trauma EXAM: CT HEAD WITHOUT CONTRAST TECHNIQUE: Contiguous axial images were obtained from the base of the skull through the vertex without intravenous contrast. COMPARISON:  None. FINDINGS: Brain: No hemorrhage or intracranial mass. Asymmetric hypodensity within the left cerebellum, suspicious for acute or subacute infarct. The ventricles are nonenlarged. Vascular: No hyperdense vessels.  No unexpected calcification Skull: Normal. Negative for fracture or focal lesion. Sinuses/Orbits: No acute finding. Other: None IMPRESSION: 1. Asymmetric low-attenuation within the left cerebellum, appearance is suspicious for acute to subacute infarct. Correlation with MRI is recommended. 2. Negative for acute intracranial hemorrhage. Critical Value/emergent results were called by telephone at the time of interpretation on 09/01/2020 at 9:18 pm to provider Sharde Gover , who verbally acknowledged these results. Electronically Signed   By: Jasmine Pang M.D.   On: 09/01/2020 21:19    Procedures .Marland KitchenLaceration Repair  Date/Time: 09/01/2020 10:18 PM Performed by: Theron Arista, PA-C Authorized by: Theron Arista, PA-C   Consent:    Consent obtained:  Verbal    Consent given by:  Patient   Risks discussed:  Infection, need for additional repair, pain, poor cosmetic result and poor wound healing   Alternatives discussed:  No treatment and delayed treatment Universal protocol:    Procedure explained and questions answered to patient or proxy's satisfaction: yes     Relevant documents present and verified: yes     Test results available: yes     Imaging studies available: yes     Required blood products, implants, devices, and special equipment available: yes     Site/side marked: yes     Immediately prior to procedure, a time out was called: yes     Patient identity confirmed:  Verbally with patient Anesthesia:    Anesthesia method:  Local infiltration   Local anesthetic:  Lidocaine 2% w/o epi Laceration details:    Location:  Scalp   Length (cm):  1   Depth (mm):  1 Treatment:    Area cleansed with:  Saline and chlorhexidine   Amount of cleaning:  Standard   Irrigation solution:  Sterile saline   Irrigation method:  Syringe Skin repair:    Repair method:  Sutures   Suture size:  5-0   Suture material:  Prolene   Number of sutures:  2 Approximation:    Approximation:  Close Repair type:    Repair type:  Simple Post-procedure details:    Dressing:  Open (no dressing)   Procedure completion:  Tolerated well, no immediate complications     Medications Ordered in ED Medications  lidocaine (XYLOCAINE) 2 % (with pres) injection 200 mg (200 mg Intradermal Given by Other 09/01/20 2023)    ED Course  I have reviewed the triage vital signs and the nursing notes.  Pertinent labs & imaging results that were available during my care of the patient were reviewed by me and considered in my medical decision making (see chart for details).  Clinical Course as of 09/01/20 2220  Tue Sep 01, 2020  2150 Patient will need transfer to [facility] for MRI brain to assess for stroke given abnormal CT.  Case discussed with Dr. Coralee Pesa at [facility] ED who accepts patient for transfer. [KF]    Clinical Course User Index [KF] Legrand Rams   MDM Rules/Calculators/A&P                         Patient is stable, nontoxic appearing. He has right eye ptosis and vision changes. Although he claims the ptosis is from bells palsy, the vision changes are concerning for a hemorrhage or hematoma. Overall, PE was reassuring. No focal deficits.   Patient will need staples for his scalp laceration. Will order CT to assess for bleed from trauma. Afterwards, will staple his laceration.   CT was concerning for stroke. See results for full read. Will suture his scalp instead so he is able to get MRI.   Patient to be transferred to [facility] for MRI. Care is being transferred.  Final Clinical Impression(s) / ED Diagnoses Final diagnoses:  None    Rx / DC Orders ED Discharge Orders    None       Theron Arista, Cordelia Poche 09/01/20 2220    Gwyneth Sprout, MD 09/09/20 [phone]

## 2020-09-01 NOTE — ED Notes (Signed)
Consulted with MRI at [facility] and instructed to send pt over at anytime.

## 2020-09-01 NOTE — ED Triage Notes (Signed)
Transfer from [facility]. Pt was moving fridge and hit head. CT was negative for head bleed, but questionable for stroke. Transferred here for MRI.   135/95  68NRS 96% RA  No IV access.

## 2020-09-01 NOTE — ED Triage Notes (Signed)
He was moving a refrigerator and it tipped over hitting him in the right side of his scalp. 1/2" lac noted. Dressing applied at triage. No LOC.

## 2020-09-02 ENCOUNTER — Emergency Department (HOSPITAL_COMMUNITY): Payer: BC Managed Care – PPO

## 2020-09-02 IMAGING — MR MR HEAD W/O CM
12 of 13 series · 44 of 48 positions shown · non-contrast
Comparison: None.

CLINICAL DATA: Head trauma with right eye blurry vision

EXAM:
MRI HEAD WITHOUT CONTRAST
TECHNIQUE: Multiplanar, multiecho pulse sequences of the brain and surrounding
structures were obtained without intravenous contrast.

[Series 5: DWI · axial · 3.0mm · 0.88mm/px · z∈[-113,+37]mm · 8 of 106 slices shown (1 of 4)]
[im 1/106]
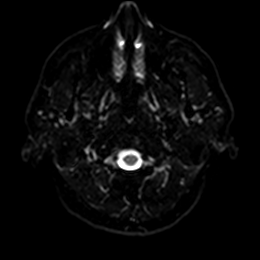
[im 16/106]
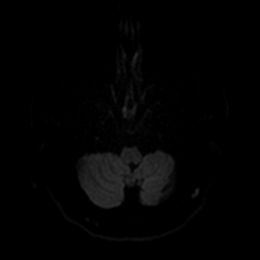
[im 31/106]
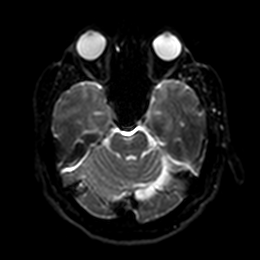
[im 46/106]
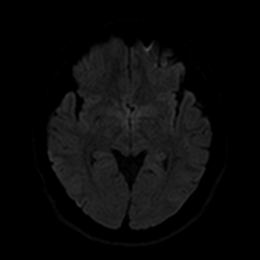
[im 61/106]
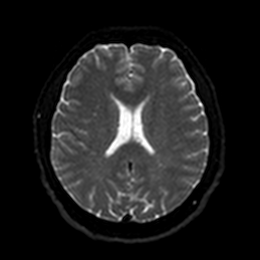
[im 76/106]
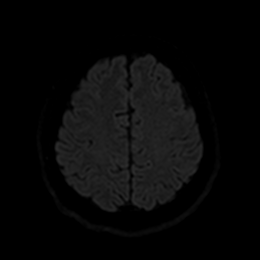
[im 91/106]
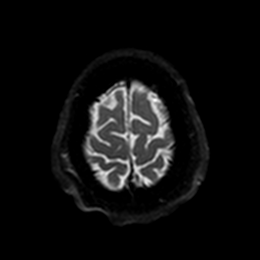
[im 106/106]
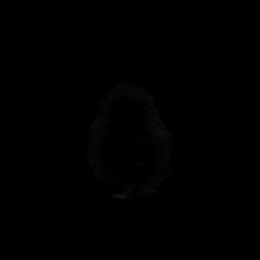

[Series 6: DWI · axial · 3.0mm · 0.88mm/px · z∈[-113,+37]mm · 4 of 53 slices shown (2 of 4)]
[im 1/53]
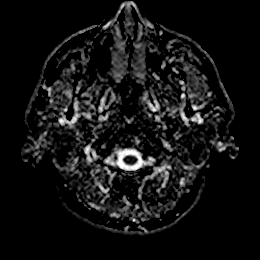
[im 18/53]
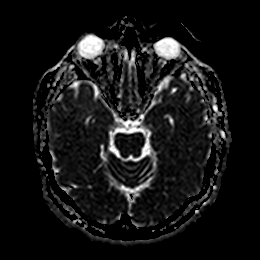
[im 35/53]
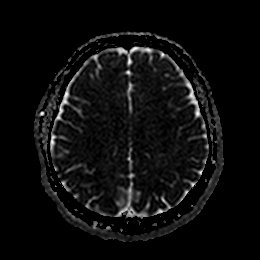
[im 53/53]
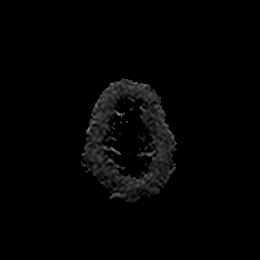

[Series 7: DWI · coronal · 4.0mm · 0.88mm/px · 5 of 72 slices shown (3 of 4)]
[im 1/72]
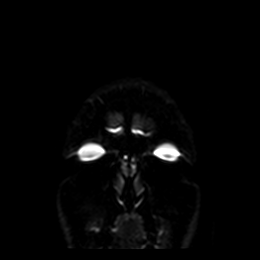
[im 18/72]
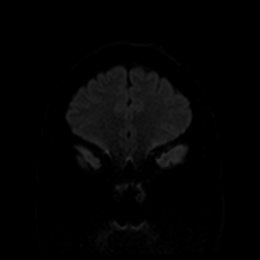
[im 36/72]
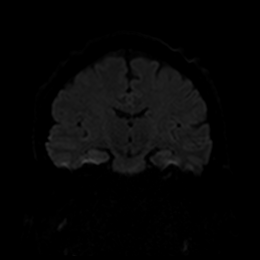
[im 54/72]
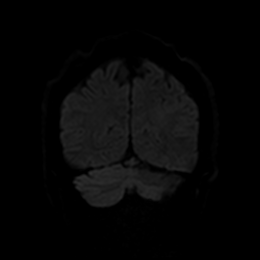
[im 72/72]
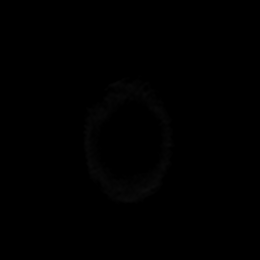

[Series 8: DWI · coronal · 4.0mm · 0.88mm/px · 3 of 36 slices shown (4 of 4)]
[im 1/36]
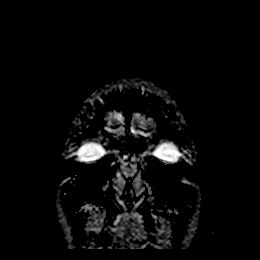
[im 18/36]
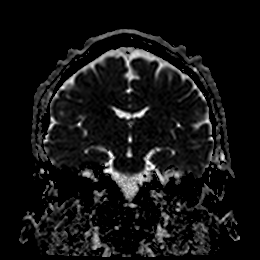
[im 36/36]
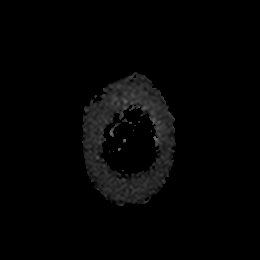

[Series 9: T1 · sagittal · 5.0mm · 0.75mm/px · 2 of 28 slices shown]
[im 1/28]
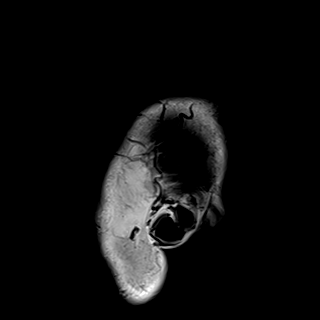
[im 28/28]
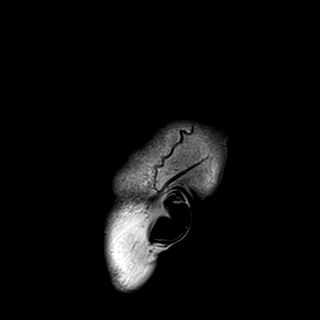

[Series 10: T2 · axial · 5.0mm · 0.72mm/px · z∈[-120,+37]mm · 2 of 28 slices shown (1 of 2)]
[im 1/28]
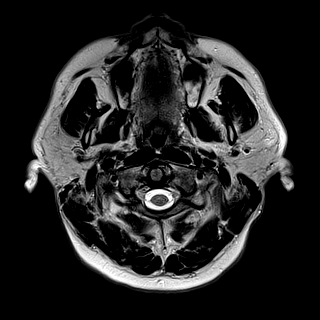
[im 28/28]
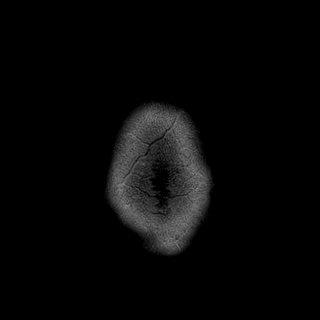

[Series 11: FLAIR · axial · 5.0mm · 0.45mm/px · z∈[-120,+37]mm · 2 of 28 slices shown]
[im 1/28]
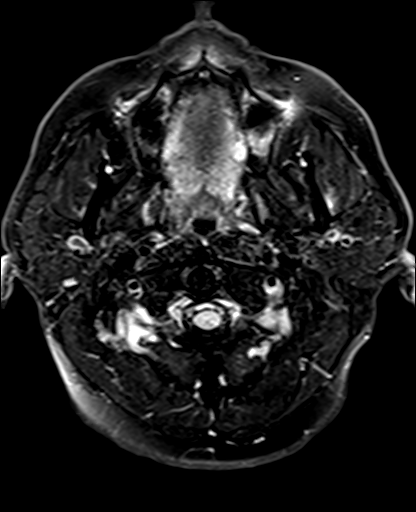
[im 28/28]
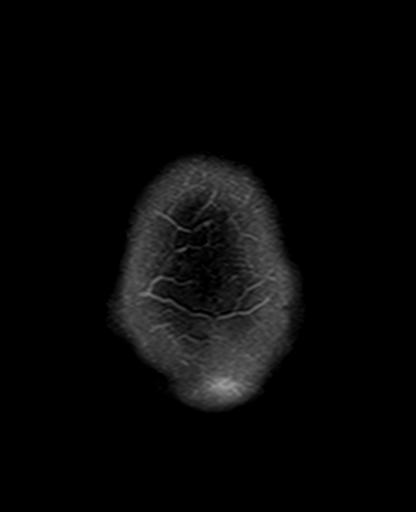

[Series 12: mag_images · axial · 3.0mm · 0.90mm/px · z∈[-121,+38]mm · 4 of 56 slices shown]
[im 1/56]
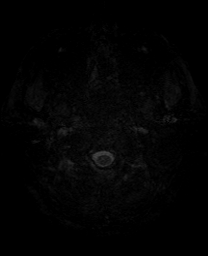
[im 19/56]
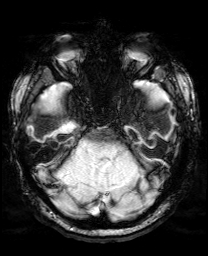
[im 37/56]
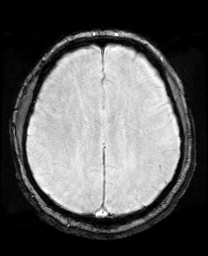
[im 56/56]
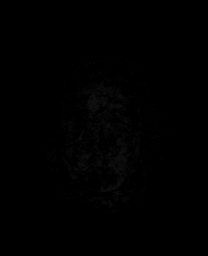

[Series 13: pha_images · axial · 3.0mm · 0.90mm/px · z∈[-121,+38]mm · 4 of 56 slices shown]
[im 1/56]
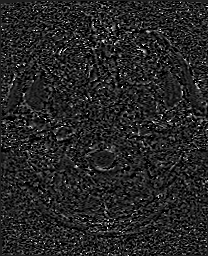
[im 19/56]
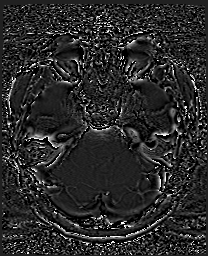
[im 37/56]
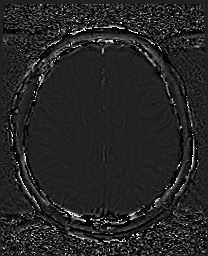
[im 56/56]
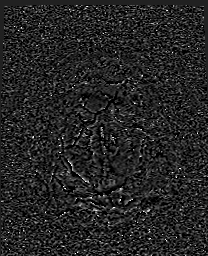

[Series 14: swi_images · axial · 3.0mm · 0.90mm/px · z∈[-121,+38]mm · 4 of 56 slices shown]
[im 1/56]
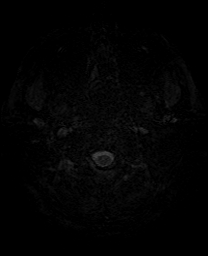
[im 19/56]
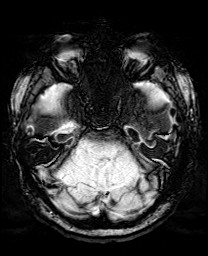
[im 37/56]
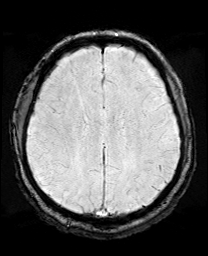
[im 56/56]
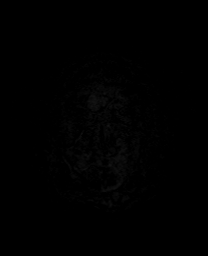

[Series 15: mip_images(sw) · axial · 24.0mm · 0.90mm/px · z∈[-111,+28]mm · 4 of 49 slices shown]
[im 1/49]
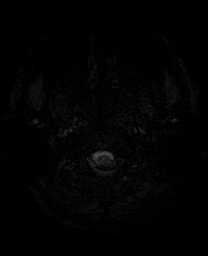
[im 17/49]
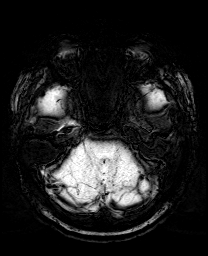
[im 33/49]
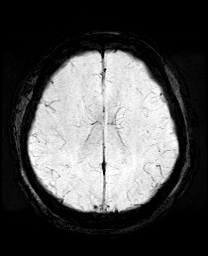
[im 49/49]
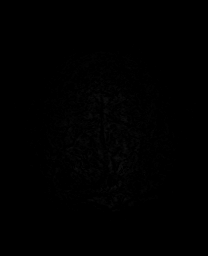

[Series 17: T2 · coronal · 5.0mm · 0.34mm/px · 2 of 29 slices shown (2 of 2)]
[im 1/29]
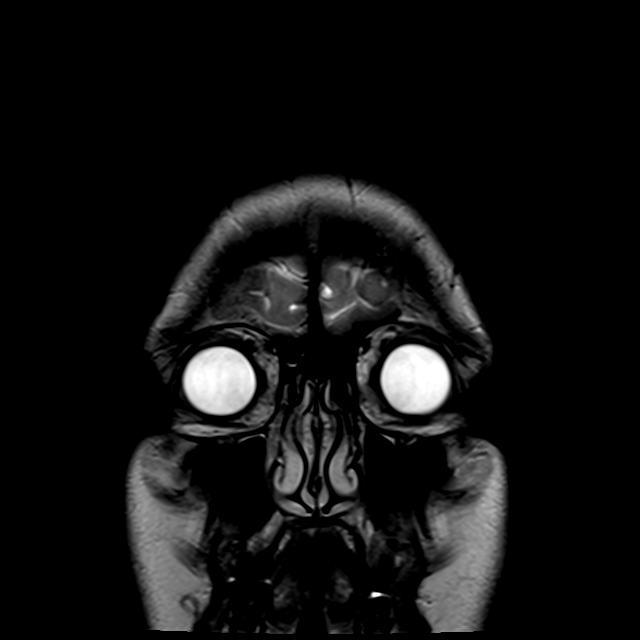
[im 29/29]
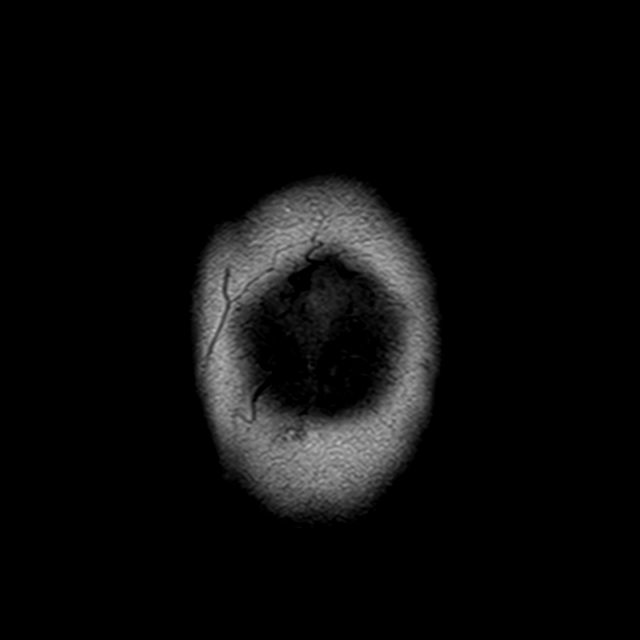

[44 of 48 positions shown; findings below may reference images not displayed]

FINDINGS: Brain: No acute infarct, mass effect or extra-axial collection. No
acute or chronic hemorrhage. Normal white matter signal, parenchymal
volume and CSF spaces. The midline structures are normal.
Hyperintense T2-weighted signal in the superior left cerebellar
hemisphere, likely subacute to chronic infarct.

Vascular: Major flow voids are preserved.

Skull and upper cervical spine: Normal calvarium and skull base.
Visualized upper cervical spine and soft tissues are normal.

Sinuses/Orbits:No paranasal sinus fluid levels or advanced mucosal
thickening. No mastoid or middle ear effusion. Normal orbits.
IMPRESSION: 1. No acute intracranial abnormality.
2. Hyperintense T2-weighted signal in the superior left cerebellar
hemisphere, consistent with subacute to chronic infarct.

## 2020-09-02 MED ORDER — ACETAMINOPHEN 325 MG PO TABS
650.0000 mg | ORAL_TABLET | Freq: Once | ORAL | Status: AC
  Administered 2020-09-02: 650 mg via ORAL
  Filled 2020-09-02: qty 2

## 2020-09-02 NOTE — ED Provider Notes (Signed)
40 year old male received as a transfer from med [facility] for MRI.  Patient was initially seen by PA [facility] after the patient presented with head trauma secondary to a refrigerator hitting him in the head. Per her HPI:   "Patient presents with head trauma. States he was removing a refrigerator and it fell on his head. He did not loose consciousness, but states he felt bleeding. He denies nausea or vomiting but endorses blurry vision to the right eye. He denies headaches. He is not on blood thinners. 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   History reviewed. No pertinent past medical history."  On my initial evaluation, patient is endorsing mild aching around the site of his laceration repair.  He otherwise denies a headache, dizziness, lightheadedness, visual changes, numbness, weakness, nausea, or vomiting.  Physical Exam  BP 128/79 (BP Location: Right Arm)   Pulse 68   Temp 99 F (37.2 C) (Oral)   Resp 20   Ht 5\' 11"  (1.803 m)   Wt 115.7 kg   SpO2 96%   BMI 35.57 kg/m   Physical Exam Vitals and nursing note reviewed.  Constitutional:      General: He is not in acute distress.    Appearance: He is well-developed.  HENT:     Head: Normocephalic and atraumatic.     Comments: Laceration to the right side of the scalp that has been repaired.  Wound is hemostatic. Cardiovascular:     Rate and Rhythm: Normal rate.  Pulmonary:     Effort: Pulmonary effort is normal.  Musculoskeletal:     Cervical back: Neck supple.  Neurological:     Mental Status: He is alert and oriented to person, place, and time.     Cranial Nerves: No cranial nerve deficit.     Comments: Moves all 4 extremities.  Good strength against resistance.  Sensation is intact and equal throughout.  Cranial nerves II through XII are grossly intact.  Psychiatric:        Behavior: Behavior normal.     ED Course/Procedures   Clinical Course as of 09/02/20  0148  Tue Sep 01, 2020  2150 Patient will need transfer to [facility] for MRI brain to assess for stroke given abnormal CT.  Case discussed with [facility] at [facility] ED who accepts patient for transfer. [KF]    Clinical Course User Index [KF] [facility]    Procedures  MDM  40 year old male received as a transfer from med 45 for MRI.  Please see PA Sage's note for further work-up and medical decision making.  In brief, this is an otherwise healthy 40 year old who presented to the emergency department with head trauma after a refrigerator fell on his head earlier today.  The wound was repaired by 45.  CT head was obtained due to mechanism of injury, which demonstrated an asymmetric low-attenuation within the left cerebellum that was suspicious for an acute to subacute infarct.  Radiology recommended correlation with MRI.  Vital signs are stable.  On my evaluation, his only complaint is aching pain around the laceration repair site.  Will treat pain with Tylenol.  No focal neurologic deficits on my repeat exam.  MRI has been reviewed and independently interpreted by me. 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  I discussed these results with Dr. Barton Dubois with neurology who personally reviewed the images.  This is not consistent with  an acute infarct.  Since patient is asymptomatic, no indication for admission for further work-up.  He can follow-up with neurology in the outpatient setting.  Discussed this plan with the patient.  Wound care instructions discussed.  ER return precautions discussed.  He is hemodynamically stable in no acute distress.  Safe for discharge home with outpatient follow-up as indicated.       Frederik Pear A, PA-C 09/02/20 0408    Virgina Norfolk, DO 09/02/20 [phone]

## 2020-09-02 NOTE — Discharge Instructions (Addendum)
Thank you for allowing me to care for you today in the Emergency Department.   Call to schedule a follow-up appointment for reevaluation in the neurology clinic.  Their office is listed above.  Take 650 mg of Tylenol or 600 mg of ibuprofen with food every 6 hours for pain.  You can alternate between these 2 medications every 3 hours if your pain returns.  For instance, you can take Tylenol at noon, followed by a dose of ibuprofen at 3, followed by second dose of Tylenol and 6.  Your stitches need to come out and in approximately 10 days.  You can go to your primary care provider's office, go to urgent care, or return to have the stitches removed.  You need to have the wound reevaluated if you develop swelling to the area, fever, chills, thick, mucus-like drainage, significantly worsening pain, new numbness, weakness, difficulty walking, or other new, concerning symptoms.

## 2020-09-02 NOTE — ED Notes (Signed)
Patient verbalizes understanding of discharge instructions. Follow-up care reviewed. Opportunity for questioning and answers were provided. Armband removed by staff, pt discharged from ED ambulatory.  

## 2020-09-03 ENCOUNTER — Encounter: Payer: Self-pay | Admitting: Neurology

## 2020-10-05 NOTE — Progress Notes (Signed)
NEUROLOGY CONSULTATION NOTE  Steve Wolfe MRN: [id number] DOB: 21-Jun-1980  Referring provider: Virgina Norfolk, DO (ED referral) Primary care provider: [facility]  Reason for consult:  stroke  Assessment/Plan:    Radiographic evidence of left cerebellar infarct, chronic - incidental finding - due to his young age and history of palpitations, concern for cardioembolic etiology History of palpitations  1.Start ASA 81mg  daily 2.Check 2 week cardiac event monitor to evaluate for a fib 3.  Check TEE 4.  Check MRA of head and neck 5.  Will obtain recent labs (lipid panel, glucose) from PCP's office. 6.  Advised to refrain from workouts until further notice. 7.  Follow up after testing.   Subjective:  Steve Wolfe is a 40 year old right-handed male who presents for stroke.  History supplemented by ED note.  CT and MRI of brain personally reviewed.  He presented to [facility] on 09/01/2020 for evaluation after he was moving a refrigerator and it fell on his head.  Noted blurred vision in the right eye. CT head showed asymmetric hypodensity within the left cerebellum concerning for acute or subacute infarct. He was transferred to [facility] for MRI brain without contrast, which showed a subacute to chronic infarct in the left cerebellar hemisphere.  Denied history of stroke but does have residual right eyelid drooping since prior history of Bell's Palsy in 2011.  As he was asymptomatic, there was no indication for admission and he was discharged with outpatient neurology follow up.  He has been doing well.  Once in awhile, he feels his heart fluttering.  It is infrequent.  He was seen in the ED in November 2019 for palpitations.  EKG was negative.  They thought it was related to pre-workout creatine and caffeine use.    PAST MEDICAL HISTORY: History reviewed. No pertinent past medical history.   PAST SURGICAL HISTORY: History reviewed. No pertinent surgical  history.  MEDICATIONS: Current Outpatient Medications on File Prior to Visit  Medication Sig Dispense Refill   cephALEXin (KEFLEX) 500 MG capsule Take 1 capsule (500 mg total) by mouth 4 (four) times daily. 28 capsule 0   HYDROcodone-acetaminophen (NORCO) 5-325 MG tablet Take 1 tablet by mouth every 6 (six) hours as needed for moderate pain. 20 tablet 0   ibuprofen (ADVIL,MOTRIN) 800 MG tablet Take 1 tablet (800 mg total) by mouth 3 (three) times daily. (Patient taking differently: Take 800 mg by mouth every 8 (eight) hours as needed for fever, mild pain or moderate pain.) 21 tablet 0   sulfamethoxazole-trimethoprim (BACTRIM DS,SEPTRA DS) 800-160 MG tablet Take 2 tablets by mouth 2 (two) times daily. 28 tablet 0   No current facility-administered medications on file prior to visit.    ALLERGIES: No Known Allergies  FAMILY HISTORY: No family history on file.  Objective:  Blood pressure 125/90, pulse 80, height 6' (1.829 m), weight 253 lb 12.8 oz (115.1 kg), SpO2 96 %. General: No acute distress.  Patient appears well-groomed.   Head:  Normocephalic/atraumatic Eyes:  fundi examined but not visualized Neck: supple, no paraspinal tenderness, full range of motion Back: No paraspinal tenderness Heart: regular rate and rhythm Lungs: Clear to auscultation bilaterally. Vascular: No carotid bruits. Neurological Exam: Mental status: alert and oriented to person, place, and time, recent and remote memory intact, fund of knowledge intact, attention and concentration intact, speech fluent and not dysarthric, language intact. Cranial nerves: CN I: not tested CN II: pupils equal, round and reactive to  light, visual fields intact CN III, IV, VI:  full range of motion, no nystagmus, no ptosis CN V: facial sensation intact. CN VII: upper and lower face symmetric CN VIII: hearing intact CN IX, X: gag intact, uvula midline CN XI: sternocleidomastoid and trapezius muscles intact CN XII: tongue  midline Bulk & Tone: normal, no fasciculations. Motor:  muscle strength 5/5 throughout Sensation:  Pinprick, temperature and vibratory sensation intact. Deep Tendon Reflexes:  2+ throughout,  toes downgoing.   Finger to nose testing:  Without dysmetria.   Heel to shin:  Without dysmetria.   Gait:  Normal station and stride.  Romberg negative.    Thank you for allowing me to take part in the care of this patient.  Shon Millet, DO  CC: [facility]

## 2020-10-06 ENCOUNTER — Other Ambulatory Visit: Payer: Self-pay

## 2020-10-06 ENCOUNTER — Ambulatory Visit (INDEPENDENT_AMBULATORY_CARE_PROVIDER_SITE_OTHER): Payer: BC Managed Care – PPO | Admitting: Neurology

## 2020-10-06 ENCOUNTER — Encounter: Payer: Self-pay | Admitting: Neurology

## 2020-10-06 ENCOUNTER — Encounter: Payer: Self-pay | Admitting: *Deleted

## 2020-10-06 VITALS — BP 125/90 | HR 80 | Ht 72.0 in | Wt 253.8 lb

## 2020-10-06 DIAGNOSIS — I639 Cerebral infarction, unspecified: Secondary | ICD-10-CM | POA: Diagnosis not present

## 2020-10-06 DIAGNOSIS — R002 Palpitations: Secondary | ICD-10-CM

## 2020-10-06 NOTE — Progress Notes (Signed)
Patient ID: Steve Wolfe, male   DOB: Mar 28, 1981, 40 y.o.   MRN: [id number] Patient enrolled for [employer] to ship a 30 day Cardiac Event Monitor to address on file.  Letter with instructions mailed to patient.

## 2020-10-06 NOTE — Patient Instructions (Signed)
Start taking aspirin 81mg  daily Check MRA of head and neck Check transesophageal echocardiogram Check two week cardiac event monitor. Will obtain labs from your PCP Refrain from working out until further notice.

## 2020-10-07 ENCOUNTER — Encounter: Payer: Self-pay | Admitting: Neurology

## 2020-10-23 ENCOUNTER — Ambulatory Visit
Admission: RE | Admit: 2020-10-23 | Discharge: 2020-10-23 | Disposition: A | Discharge status: Home / Self Care | Payer: BC Managed Care – PPO | Source: Ambulatory Visit | Attending: Neurology | Admitting: Neurology

## 2020-10-23 DIAGNOSIS — I639 Cerebral infarction, unspecified: Secondary | ICD-10-CM

## 2020-10-23 DIAGNOSIS — R002 Palpitations: Secondary | ICD-10-CM

## 2020-10-23 IMAGING — MR MR MRA NECK WO/W CM
1 of 2 series · 24 of 48 positions shown · IV contrast (multihance)
Comparison: Comparison made with recent brain MRI from [DATE].

CLINICAL DATA: Follow-up examination for left cerebellar stroke,
seen on previous brain MRI from [DATE]

EXAM:
MRA HEAD WITHOUT CONTRAST
MRA NECK WITHOUT AND WITH CONTRAST
TECHNIQUE: Multiplanar, multi-echo pulse sequences of the brain and surrounding
structures were acquired without intravenous contrast. Angiographic
images of the Circle of Willis were acquired using MRA technique
without intravenous contrast. Angiographic images of the neck were
acquired using MRA technique without and with intravenous contrast.
Carotid stenosis measurements (when applicable) are obtained
utilizing NASCET criteria, using the distal internal carotid
diameter as the denominator.
CONTRAST:  20mL MULTIHANCE GADOBENATE DIMEGLUMINE 529 MG/ML IV SOLN

[Series 12: angio_fl3d_cor_post_sub · coronal · 0.8mm · 0.85mm/px · 24 of 96 slices shown]
[im 1/96]
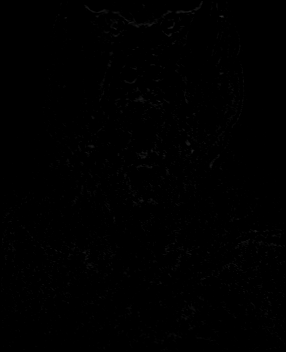
[im 5/96]
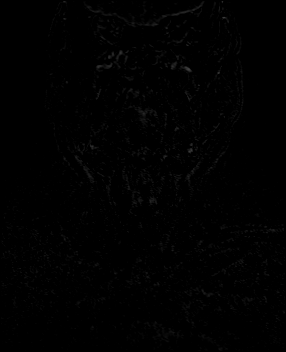
[im 9/96]
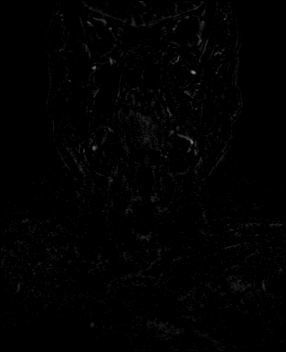
[im 13/96]
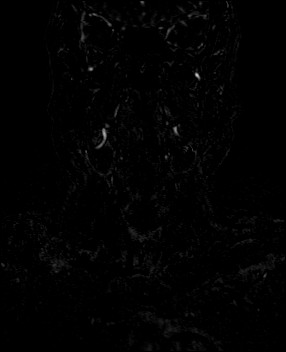
[im 17/96]
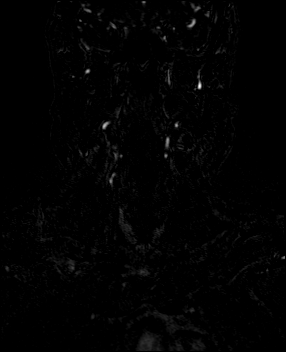
[im 21/96]
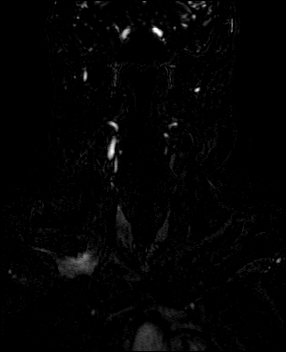
[im 25/96]
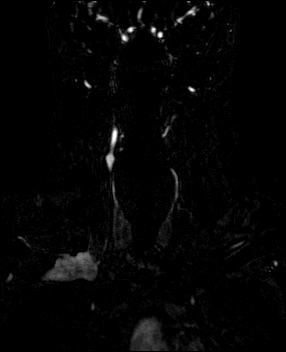
[im 29/96]
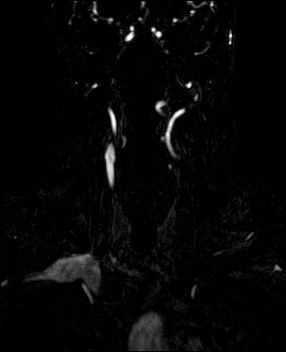
[im 34/96]
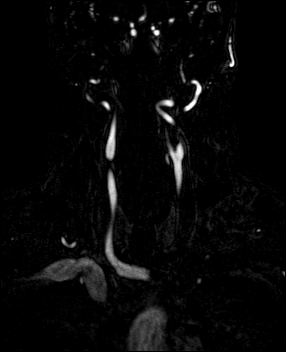
[im 38/96]
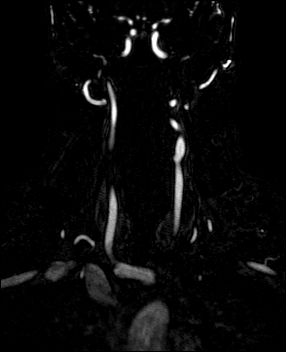
[im 42/96]
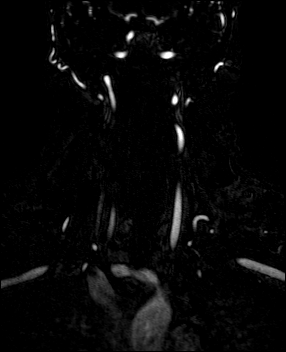
[im 46/96]
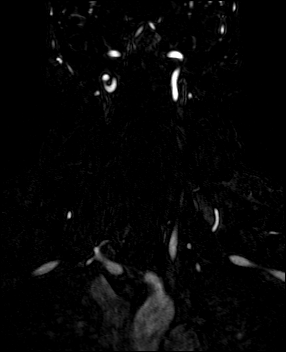
[im 50/96]
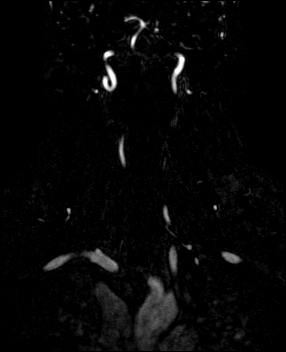
[im 54/96]
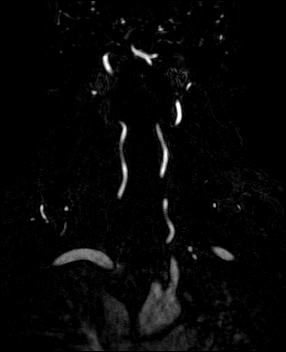
[im 58/96]
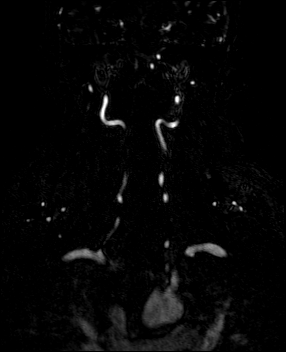
[im 62/96]
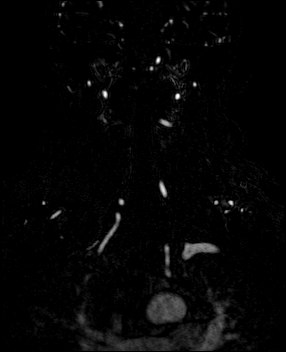
[im 67/96]
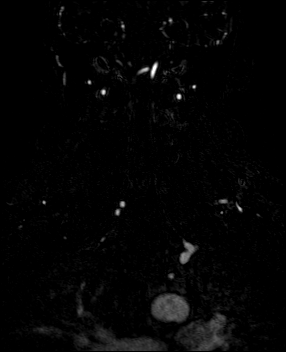
[im 71/96]
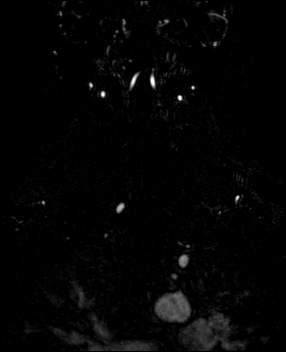
[im 75/96]
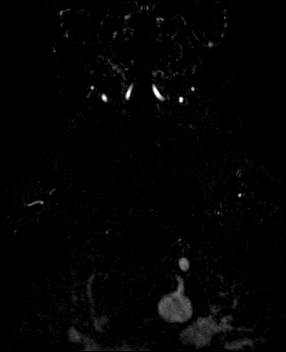
[im 79/96]
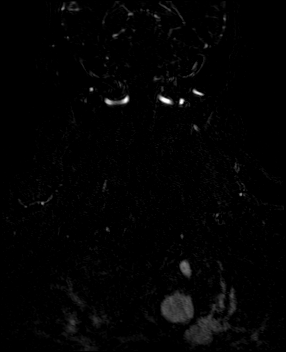
[im 83/96]
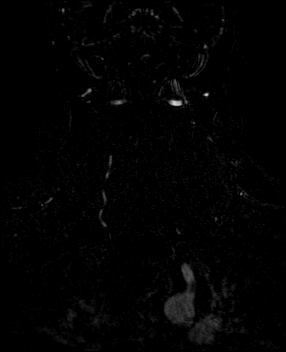
[im 87/96]
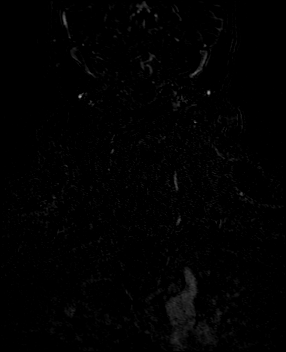
[im 91/96]
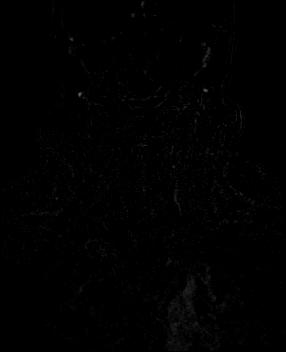
[im 96/96]
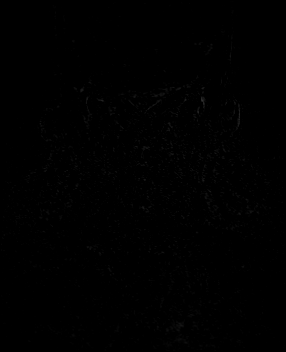

[24 of 48 positions shown; findings below may reference images not displayed]

FINDINGS: MRA HEAD FINDINGS

Anterior circulation: Distal cervical segments of the internal
carotid arteries are widely patent with symmetric antegrade flow.
Petrous, cavernous, and supraclinoid segments widely patent without
stenosis or other abnormality. Origins of the ophthalmic arteries
and posterior communicating arteries are normal. ICA termini well
perfused. A1 segments patent bilaterally. Normal anterior
communicating artery complex. A2 segments widely patent to their
distal aspects without stenosis. No M1 stenosis or occlusion. Normal
MCA bifurcations. Distal MCA branches well perfused and symmetric.

Posterior circulation: Codominant vertebral arteries widely patent
to the vertebrobasilar junction without stenosis or other
abnormality. Left PICA patent. Right PICA not seen. Dominant right
AICA. Basilar mildly tortuous but is widely patent to its distal
aspect without stenosis. Superior cerebellar arteries patent
bilaterally. Both PCAs primarily supplied via the basilar well
perfused or distal aspects.

Anatomic variants: None significant.  No intracranial aneurysm.

MRA NECK FINDINGS

Aortic arch: Visualized aortic arch normal in caliber with normal
branch pattern. No stenosis or other abnormality seen about the
origin of the great vessels.

Right carotid system: Right CCA widely patent from its origin to the
bifurcation without stenosis. No significant atheromatous
irregularity or narrowing about the right carotid bulb/proximal
right ICA. Right ICA mildly tortuous but is widely patent distally
to the skull base without stenosis, evidence for dissection, or
occlusion.

Left carotid system: Left CCA widely patent from its origin to the
bifurcation. No atheromatous narrowing or irregularity about the
left carotid bulb. Left ICA mildly tortuous but is widely patent
distally to the skull base without stenosis, evidence for dissection
or occlusion.

Vertebral arteries: Left vertebral artery arises directly from the
aortic arch. Vertebral arteries are largely codominant. Vertebral
arteries widely patent without stenosis, evidence for dissection or
occlusion.

Other: None
IMPRESSION: 1. Normal intracranial MRA. No evidence for large vessel occlusion,
hemodynamically significant stenosis, or other abnormality. No
aneurysm.
2. Normal MRA of the neck with wide patency of both carotid artery
systems and vertebral arteries. No significant stenosis, evidence
for dissection, or other abnormality to explain patient's prior left
cerebellar infarct.

## 2020-10-23 IMAGING — MR MR MRA HEAD W/O CM
1 series · 10 of 48 positions shown · IV contrast (multihance)
Comparison: Comparison made with recent brain MRI from [DATE].

CLINICAL DATA: Follow-up examination for left cerebellar stroke,
seen on previous brain MRI from [DATE]

EXAM:
MRA HEAD WITHOUT CONTRAST
MRA NECK WITHOUT AND WITH CONTRAST
TECHNIQUE: Multiplanar, multi-echo pulse sequences of the brain and surrounding
structures were acquired without intravenous contrast. Angiographic
images of the Circle of Willis were acquired using MRA technique
without intravenous contrast. Angiographic images of the neck were
acquired using MRA technique without and with intravenous contrast.
Carotid stenosis measurements (when applicable) are obtained
utilizing NASCET criteria, using the distal internal carotid
diameter as the denominator.
CONTRAST:  20mL MULTIHANCE GADOBENATE DIMEGLUMINE 529 MG/ML IV SOLN

[Series 5: tof_fl3d_tra_p2_multi-slab · axial · 0.6mm · 0.26mm/px · z∈[-16,+52]mm · 10 of 164 slices shown]
[im 11/164]
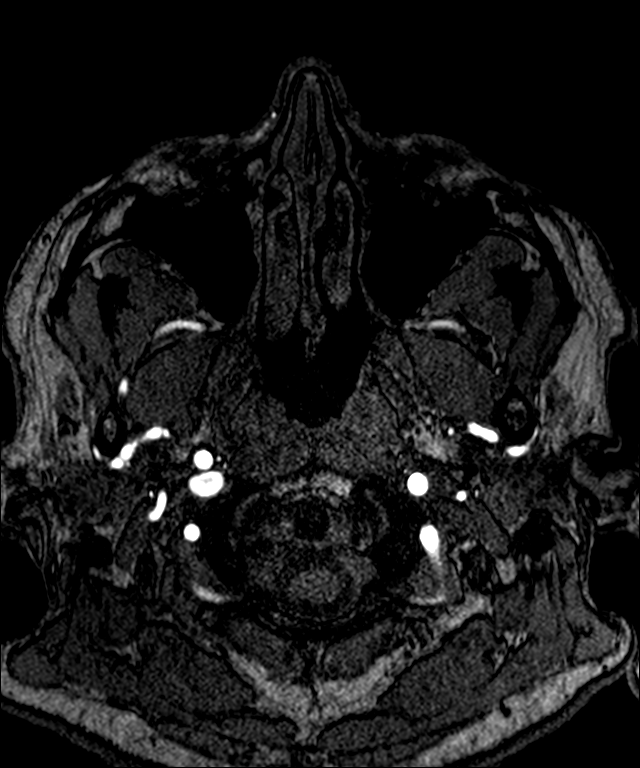
[im 28/164]
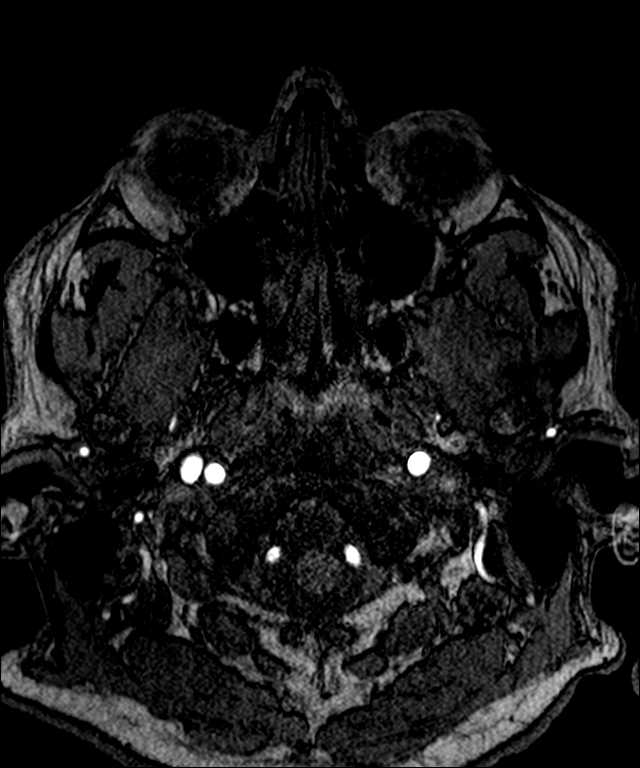
[im 32/164]
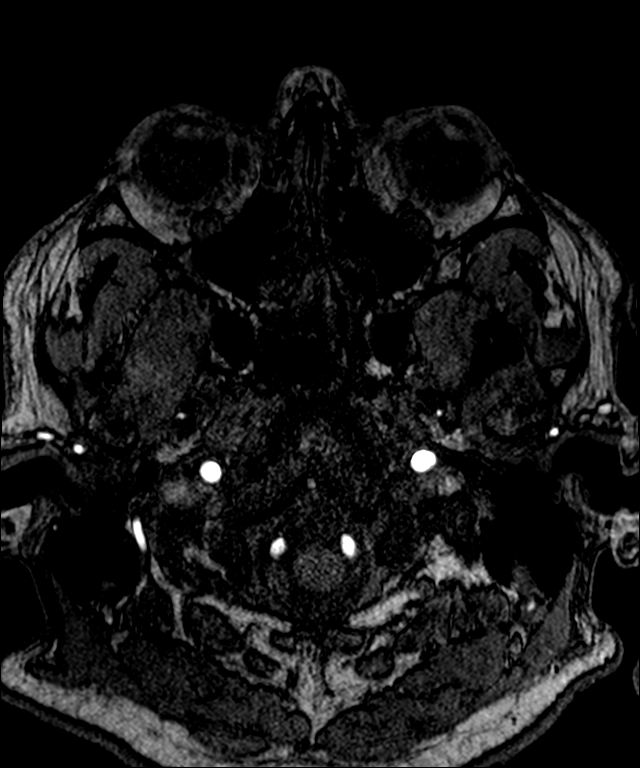
[im 53/164]
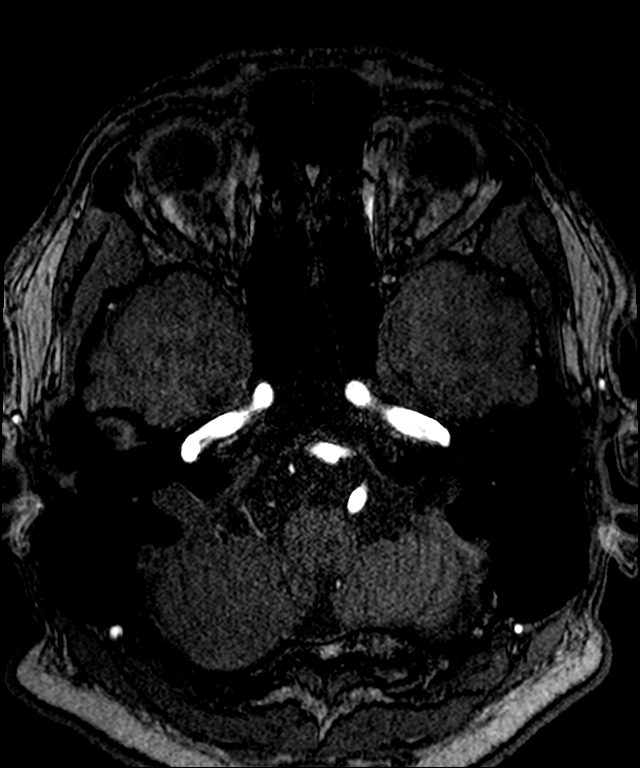
[im 73/164]
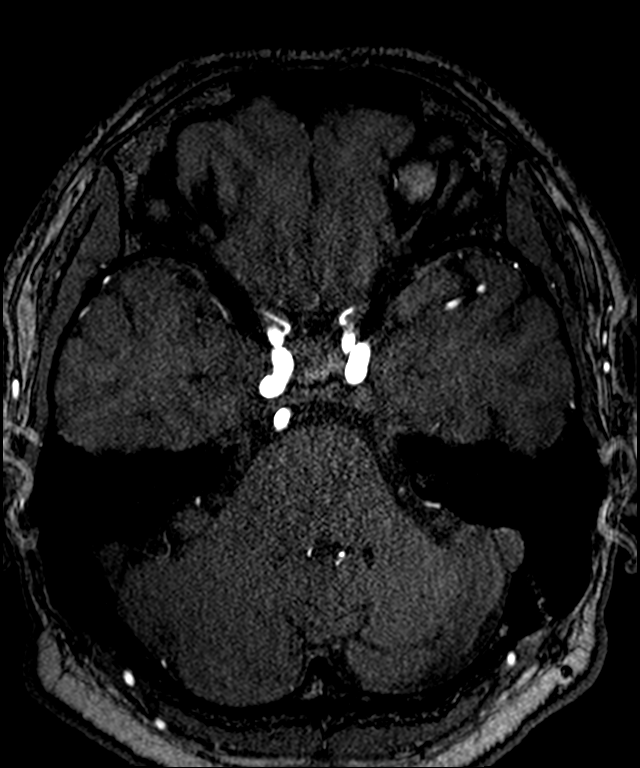
[im 84/164]
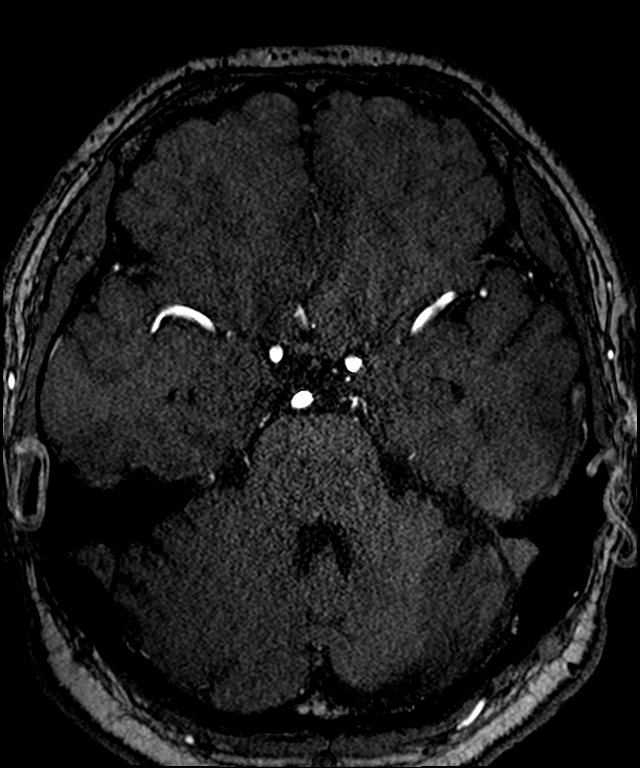
[im 94/164]
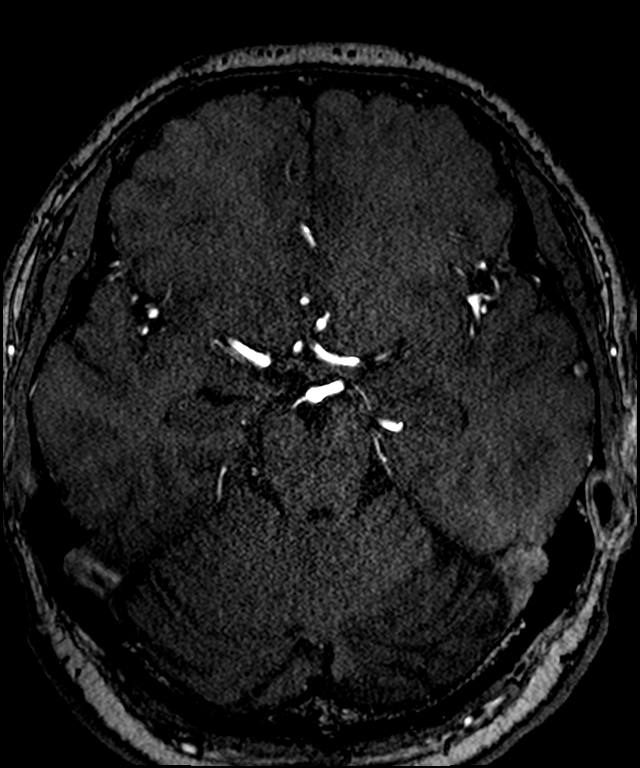
[im 115/164]
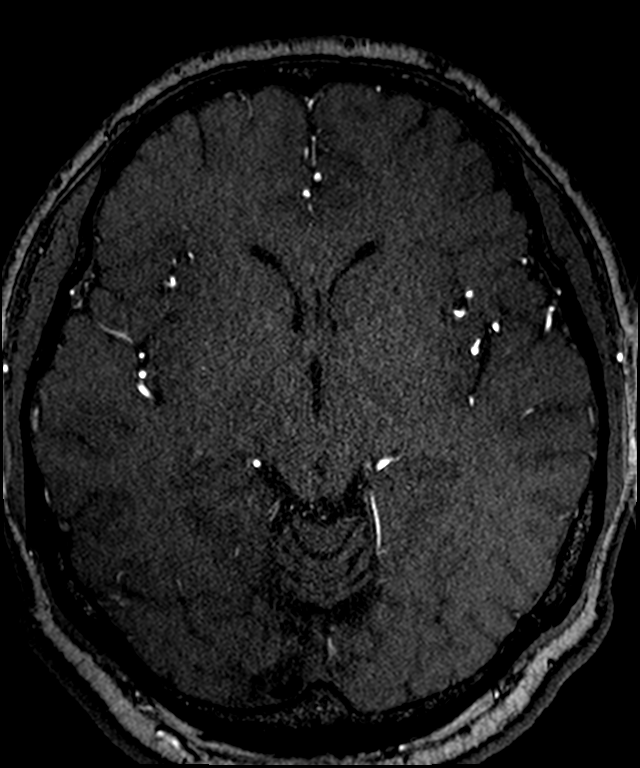
[im 136/164]
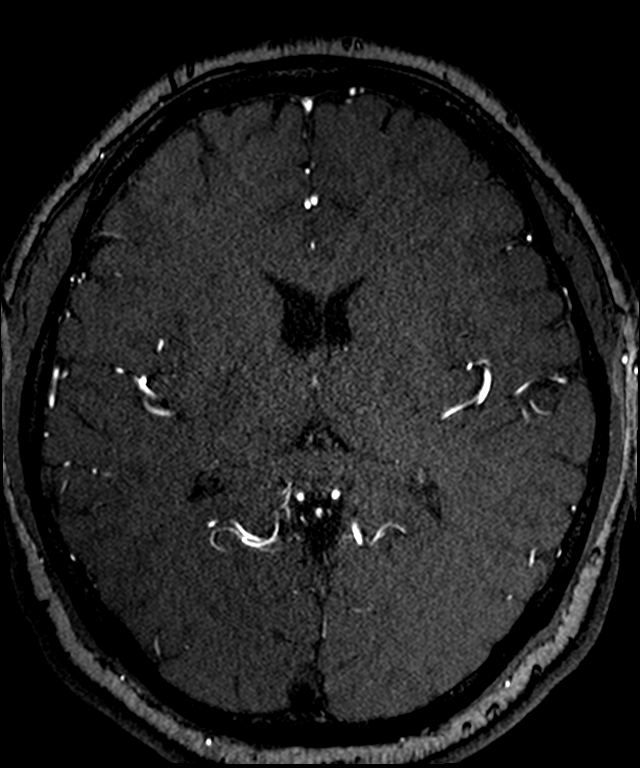
[im 139/164]
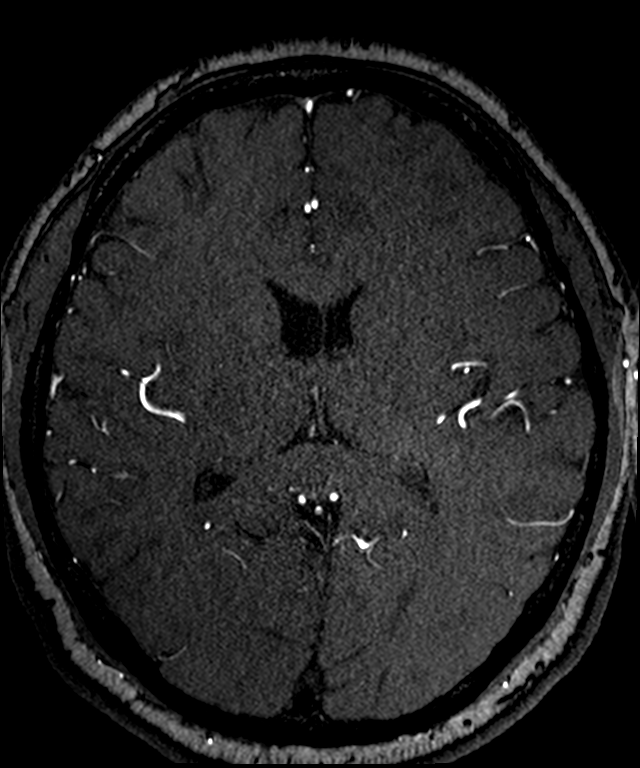

[10 of 48 positions shown; findings below may reference images not displayed]

FINDINGS: MRA HEAD FINDINGS

Anterior circulation: Distal cervical segments of the internal
carotid arteries are widely patent with symmetric antegrade flow.
Petrous, cavernous, and supraclinoid segments widely patent without
stenosis or other abnormality. Origins of the ophthalmic arteries
and posterior communicating arteries are normal. ICA termini well
perfused. A1 segments patent bilaterally. Normal anterior
communicating artery complex. A2 segments widely patent to their
distal aspects without stenosis. No M1 stenosis or occlusion. Normal
MCA bifurcations. Distal MCA branches well perfused and symmetric.

Posterior circulation: Codominant vertebral arteries widely patent
to the vertebrobasilar junction without stenosis or other
abnormality. Left PICA patent. Right PICA not seen. Dominant right
AICA. Basilar mildly tortuous but is widely patent to its distal
aspect without stenosis. Superior cerebellar arteries patent
bilaterally. Both PCAs primarily supplied via the basilar well
perfused or distal aspects.

Anatomic variants: None significant.  No intracranial aneurysm.

MRA NECK FINDINGS

Aortic arch: Visualized aortic arch normal in caliber with normal
branch pattern. No stenosis or other abnormality seen about the
origin of the great vessels.

Right carotid system: Right CCA widely patent from its origin to the
bifurcation without stenosis. No significant atheromatous
irregularity or narrowing about the right carotid bulb/proximal
right ICA. Right ICA mildly tortuous but is widely patent distally
to the skull base without stenosis, evidence for dissection, or
occlusion.

Left carotid system: Left CCA widely patent from its origin to the
bifurcation. No atheromatous narrowing or irregularity about the
left carotid bulb. Left ICA mildly tortuous but is widely patent
distally to the skull base without stenosis, evidence for dissection
or occlusion.

Vertebral arteries: Left vertebral artery arises directly from the
aortic arch. Vertebral arteries are largely codominant. Vertebral
arteries widely patent without stenosis, evidence for dissection or
occlusion.

Other: None
IMPRESSION: 1. Normal intracranial MRA. No evidence for large vessel occlusion,
hemodynamically significant stenosis, or other abnormality. No
aneurysm.
2. Normal MRA of the neck with wide patency of both carotid artery
systems and vertebral arteries. No significant stenosis, evidence
for dissection, or other abnormality to explain patient's prior left
cerebellar infarct.

## 2020-10-23 MED ORDER — GADOBENATE DIMEGLUMINE 529 MG/ML IV SOLN
20.0000 mL | Freq: Once | INTRAVENOUS | Status: AC | PRN
  Administered 2020-10-23: 20 mL via INTRAVENOUS

## 2020-10-27 ENCOUNTER — Telehealth: Payer: Self-pay | Admitting: Neurology

## 2020-10-27 NOTE — Telephone Encounter (Signed)
Pt called

## 2020-10-27 NOTE — Telephone Encounter (Signed)
Steve Wolfe is returning christy's phone call regarding the MRI results. [phone]

## 2020-10-27 NOTE — Progress Notes (Signed)
Left message to call office

## 2020-12-23 NOTE — Progress Notes (Signed)
Monitor was never applied order will be cancelled 

## 2021-04-20 NOTE — Progress Notes (Deleted)
NEUROLOGY FOLLOW UP OFFICE NOTE  Steve Wolfe [id number]  Assessment/Plan:    Radiographic evidence of left cerebellar infarct, chronic - incidental finding - due to his young age and history of palpitations, concern for cardioembolic etiology History of palpitations   1.Start ASA 81mg  daily 2.Check 2 week cardiac event monitor to evaluate for a fib 3.  Check TEE 4.  Check MRA of head and neck 5.  Will obtain recent labs (lipid panel, glucose) from PCP's office. 6.  Advised to refrain from workouts until further notice. 7.  Follow up after testing.     Subjective:  Steve Wolfe is a 41 year old right-handed male who follows up for stroke.  UPDATE: MRA of head and neck on 10/23/2020 personally reviewed was normal.  Echo and cardiac event monitor were ordered but ***   HISTORY: He presented to [facility] on 09/01/2020 for evaluation after he was moving a refrigerator and it fell on his head.  Noted blurred vision in the right eye. CT head showed asymmetric hypodensity within the left cerebellum concerning for acute or subacute infarct. He was transferred to [facility] for MRI brain without contrast, which showed a subacute to chronic infarct in the left cerebellar hemisphere.  Denied history of stroke but does have residual right eyelid drooping since prior history of Bell's Palsy in 2011.  As he was asymptomatic, there was no indication for admission and he was discharged with outpatient neurology follow up.  He has been doing well.  Once in awhile, he feels his heart fluttering.  It is infrequent.  He was seen in the ED in November 2019 for palpitations.  EKG was negative.  They thought it was related to pre-workout creatine and caffeine use.    PAST MEDICAL HISTORY: No past medical history on file.  MEDICATIONS: Current Outpatient Medications on File Prior to Visit  Medication Sig Dispense Refill   cephALEXin (KEFLEX) 500 MG capsule Take 1 capsule (500 mg total) by  mouth 4 (four) times daily. (Patient not taking: Reported on 10/06/2020) 28 capsule 0   cholecalciferol (VITAMIN D3) 25 MCG (1000 UNIT) tablet Take 1,000 Units by mouth daily.     HYDROcodone-acetaminophen (NORCO) 5-325 MG tablet Take 1 tablet by mouth every 6 (six) hours as needed for moderate pain. (Patient not taking: Reported on 10/06/2020) 20 tablet 0   ibuprofen (ADVIL,MOTRIN) 800 MG tablet Take 1 tablet (800 mg total) by mouth 3 (three) times daily. (Patient not taking: Reported on 10/06/2020) 21 tablet 0   sulfamethoxazole-trimethoprim (BACTRIM DS,SEPTRA DS) 800-160 MG tablet Take 2 tablets by mouth 2 (two) times daily. (Patient not taking: Reported on 10/06/2020) 28 tablet 0   No current facility-administered medications on file prior to visit.    ALLERGIES: No Known Allergies  FAMILY HISTORY: No family history on file.    Objective:  *** General: No acute distress.  Patient appears ***-groomed.   Head:  Normocephalic/atraumatic Eyes:  Fundi examined but not visualized Neck: supple, no paraspinal tenderness, full range of motion Heart:  Regular rate and rhythm Lungs:  Clear to auscultation bilaterally Back: No paraspinal tenderness Neurological Exam: alert and oriented to person, place, and time.  Speech fluent and not dysarthric, language intact.  CN II-XII intact. Bulk and tone normal, muscle strength 5/5 throughout.  Sensation to light touch intact.  Deep tendon reflexes 2+ throughout, toes downgoing.  Finger to nose testing intact.  Gait normal, Romberg negative.   10/08/2020, DO  CC: ***

## 2021-04-21 ENCOUNTER — Ambulatory Visit: Payer: BC Managed Care – PPO | Admitting: Neurology

## 2021-04-21 ENCOUNTER — Encounter: Payer: Self-pay | Admitting: Neurology

## 2021-04-21 DIAGNOSIS — Z029 Encounter for administrative examinations, unspecified: Secondary | ICD-10-CM

## 2022-10-21 ENCOUNTER — Other Ambulatory Visit: Payer: Self-pay

## 2022-10-21 ENCOUNTER — Emergency Department (HOSPITAL_BASED_OUTPATIENT_CLINIC_OR_DEPARTMENT_OTHER)
Admission: EM | Admit: 2022-10-21 | Discharge: 2022-10-21 | Disposition: A | Discharge status: Home / Self Care | Payer: No Typology Code available for payment source | Attending: Emergency Medicine | Admitting: Emergency Medicine

## 2022-10-21 ENCOUNTER — Encounter (HOSPITAL_BASED_OUTPATIENT_CLINIC_OR_DEPARTMENT_OTHER): Payer: Self-pay | Admitting: Emergency Medicine

## 2022-10-21 DIAGNOSIS — M5481 Occipital neuralgia: Secondary | ICD-10-CM | POA: Diagnosis not present

## 2022-10-21 DIAGNOSIS — R519 Headache, unspecified: Secondary | ICD-10-CM | POA: Diagnosis present

## 2022-10-21 HISTORY — DX: Cerebral infarction, unspecified: I63.9

## 2022-10-21 MED ORDER — KETOROLAC TROMETHAMINE 15 MG/ML IJ SOLN
15.0000 mg | Freq: Once | INTRAMUSCULAR | Status: AC
  Administered 2022-10-21: 15 mg via INTRAVENOUS
  Filled 2022-10-21: qty 1

## 2022-10-21 MED ORDER — PROCHLORPERAZINE EDISYLATE 10 MG/2ML IJ SOLN
10.0000 mg | Freq: Once | INTRAMUSCULAR | Status: AC
  Administered 2022-10-21: 10 mg via INTRAVENOUS
  Filled 2022-10-21: qty 2

## 2022-10-21 MED ORDER — DIPHENHYDRAMINE HCL 50 MG/ML IJ SOLN
25.0000 mg | Freq: Once | INTRAMUSCULAR | Status: AC
  Administered 2022-10-21: 25 mg via INTRAVENOUS
  Filled 2022-10-21: qty 1

## 2022-10-21 MED ORDER — DEXAMETHASONE 4 MG PO TABS
10.0000 mg | ORAL_TABLET | Freq: Once | ORAL | Status: AC
  Administered 2022-10-21: 10 mg via ORAL
  Filled 2022-10-21: qty 3

## 2022-10-21 NOTE — Discharge Instructions (Signed)
Follow-up with your doctor or in the office.  Please return for sudden worsening one-sided numbness or weakness or difficulty speech or swallowing.

## 2022-10-21 NOTE — ED Provider Notes (Signed)
[facility] Provider Note   CSN: [id number] Arrival date & time: 10/21/22  0536     History  Chief Complaint  Patient presents with   Headache    Steve Wolfe is a 42 y.o. male.  42 yo M with a chief complaint of a headache.  This has been going on for couple weeks.  He seems to notice it most when he tries to go to sleep.  Has been keeping him from sleeping.  He has gotten a bit more worried about it because he is never had a headache that lasted this long before.  Typically goes away with Tylenol and when he is up and moving around he does not seem to notice it.  Has been exercising without noticing any discomfort.  He denies one-sided numbness or weakness denies difficulty with speech or swallowing.  He denies trauma to the head or the neck.  He had a tick on him last week but denies any engorged tick denies rash.  Denies cough or congestion or fever.  Headache is occipital primarily but sometimes feels like it is all over.   Headache      Home Medications Prior to Admission medications   Medication Sig Start Date End Date Taking? Authorizing Provider  cephALEXin (KEFLEX) 500 MG capsule Take 1 capsule (500 mg total) by mouth 4 (four) times daily. Patient not taking: Reported on 10/06/2020 05/24/17   Lenda Kelp, MD  cholecalciferol (VITAMIN D3) 25 MCG (1000 UNIT) tablet Take 1,000 Units by mouth daily.    [provider]  HYDROcodone-acetaminophen (NORCO) 5-325 MG tablet Take 1 tablet by mouth every 6 (six) hours as needed for moderate pain. Patient not taking: Reported on 10/06/2020 05/24/17   Lenda Kelp, MD  ibuprofen (ADVIL,MOTRIN) 800 MG tablet Take 1 tablet (800 mg total) by mouth 3 (three) times daily. Patient not taking: Reported on 10/06/2020 05/25/17   Palumbo, April, MD  sulfamethoxazole-trimethoprim (BACTRIM DS,SEPTRA DS) 800-160 MG tablet Take 2 tablets by mouth 2 (two) times daily. Patient not taking: Reported on  10/06/2020 05/30/17   Lenda Kelp, MD      Allergies    Patient has no known allergies.    Review of Systems   Review of Systems  Neurological:  Positive for headaches.    Physical Exam Updated Vital Signs BP (!) 137/102   Pulse 89   Temp 97.7 F (36.5 C) (Oral)   Resp 15   Ht 6' (1.829 m)   Wt 119.3 kg   SpO2 97%   BMI 35.67 kg/m  Physical Exam Vitals and nursing note reviewed.  Constitutional:      Appearance: He is well-developed.  HENT:     Head: Normocephalic and atraumatic.     Comments: Posterior nasal drip.  No obvious sinus tenderness to percussion Eyes:     Pupils: Pupils are equal, round, and reactive to light.  Neck:     Vascular: No JVD.  Cardiovascular:     Rate and Rhythm: Normal rate and regular rhythm.     Heart sounds: No murmur heard.    No friction rub. No gallop.  Pulmonary:     Effort: No respiratory distress.     Breath sounds: No wheezing.  Abdominal:     General: There is no distension.     Tenderness: There is no abdominal tenderness. There is no guarding or rebound.  Musculoskeletal:        General: Normal  range of motion.     Cervical back: Normal range of motion and neck supple.  Skin:    Coloration: Skin is not pale.     Findings: No rash.  Neurological:     Mental Status: He is alert and oriented to person, place, and time.     Cranial Nerves: Cranial nerves 2-12 are intact.     Sensory: Sensation is intact.     Motor: Motor function is intact.     Coordination: Coordination is intact.     Gait: Gait is intact.     Comments: Benign neurologic exam.  Ambulates without issue.  Psychiatric:        Behavior: Behavior normal.     ED Results / Procedures / Treatments   Labs (all labs ordered are listed, but only abnormal results are displayed) Labs Reviewed - No data to display  EKG None  Radiology No results found.  Procedures Procedures    Medications Ordered in ED Medications  ketorolac (TORADOL) 15 MG/ML  injection 15 mg (has no administration in time range)  prochlorperazine (COMPAZINE) injection 10 mg (has no administration in time range)  diphenhydrAMINE (BENADRYL) injection 25 mg (has no administration in time range)  dexamethasone (DECADRON) tablet 10 mg (has no administration in time range)    ED Course/ Medical Decision Making/ A&P                             Medical Decision Making Risk Prescription drug management.   42 yo M with a chief complaints of an occipital headache.  This been going on for the better part of 2 weeks.  He has been having some trouble with insomnia as well.  Seems to notice it mostly when he is trying to go to sleep.  He has a benign neurologic exam.  He has had an MRI a couple years ago and was found to have a subacute stroke.   Will give a headache cocktail here.  Have him follow-up with the neurologist in the office.  5:56 AM:  I have discussed the diagnosis/risks/treatment options with the patient.  Evaluation and diagnostic testing in the emergency department does not suggest an emergent condition requiring admission or immediate intervention beyond what has been performed at this time.  They will follow up with PCP, neuro. We also discussed returning to the ED immediately if new or worsening sx occur. We discussed the sx which are most concerning (e.g., sudden worsening pain, fever, inability to tolerate by mouth) that necessitate immediate return. Medications administered to the patient during their visit and any new prescriptions provided to the patient are listed below.  Medications given during this visit Medications  ketorolac (TORADOL) 15 MG/ML injection 15 mg (has no administration in time range)  prochlorperazine (COMPAZINE) injection 10 mg (has no administration in time range)  diphenhydrAMINE (BENADRYL) injection 25 mg (has no administration in time range)  dexamethasone (DECADRON) tablet 10 mg (has no administration in time range)     The  patient appears reasonably screen and/or stabilized for discharge and I doubt any other medical condition or other [facility] requiring further screening, evaluation, or treatment in the ED at this time prior to discharge.          Final Clinical Impression(s) / ED Diagnoses Final diagnoses:  Occipital headache    Rx / DC Orders ED Discharge Orders     None  Melene Plan, DO 10/21/22 [phone]

## 2022-10-21 NOTE — ED Triage Notes (Signed)
Pt states severe headache X 2 weeks states getting worse, has tried OTC pain meds with no relief. Denies injury.

## 2023-03-13 ENCOUNTER — Emergency Department (HOSPITAL_BASED_OUTPATIENT_CLINIC_OR_DEPARTMENT_OTHER)
Admission: EM | Admit: 2023-03-13 | Discharge: 2023-03-13 | Disposition: A | Discharge status: Home / Self Care | Payer: No Typology Code available for payment source | Attending: Emergency Medicine | Admitting: Emergency Medicine

## 2023-03-13 ENCOUNTER — Emergency Department (HOSPITAL_BASED_OUTPATIENT_CLINIC_OR_DEPARTMENT_OTHER): Payer: No Typology Code available for payment source

## 2023-03-13 ENCOUNTER — Encounter (HOSPITAL_BASED_OUTPATIENT_CLINIC_OR_DEPARTMENT_OTHER): Payer: Self-pay

## 2023-03-13 ENCOUNTER — Other Ambulatory Visit: Payer: Self-pay

## 2023-03-13 DIAGNOSIS — R0789 Other chest pain: Secondary | ICD-10-CM

## 2023-03-13 DIAGNOSIS — Z8673 Personal history of transient ischemic attack (TIA), and cerebral infarction without residual deficits: Secondary | ICD-10-CM | POA: Diagnosis not present

## 2023-03-13 DIAGNOSIS — K219 Gastro-esophageal reflux disease without esophagitis: Secondary | ICD-10-CM | POA: Diagnosis not present

## 2023-03-13 LAB — CBC
HCT: 43.4 % (ref 39.0–52.0)
Hemoglobin: 14.8 g/dL (ref 13.0–17.0)
MCH: 29.6 pg (ref 26.0–34.0)
MCHC: 34.1 g/dL (ref 30.0–36.0)
MCV: 86.8 fL (ref 80.0–100.0)
Platelets: 217 10*3/uL (ref 150–400)
RBC: 5 MIL/uL (ref 4.22–5.81)
RDW: 13.1 % (ref 11.5–15.5)
WBC: 5.5 10*3/uL (ref 4.0–10.5)
nRBC: 0 % (ref 0.0–0.2)

## 2023-03-13 LAB — BASIC METABOLIC PANEL
Anion gap: 12 (ref 5–15)
BUN: 14 mg/dL (ref 6–20)
CO2: 24 mmol/L (ref 22–32)
Calcium: 10.1 mg/dL (ref 8.9–10.3)
Chloride: 101 mmol/L (ref 98–111)
Creatinine, Ser: 1.22 mg/dL (ref 0.61–1.24)
GFR, Estimated: >60 mL/min (ref >60)
Glucose, Bld: 121 mg/dL — ABNORMAL HIGH (ref 70–99)
Potassium: 3.6 mmol/L (ref 3.5–5.1)
Sodium: 137 mmol/L (ref 135–145)

## 2023-03-13 LAB — HEPATIC FUNCTION PANEL
ALT: 60 U/L — ABNORMAL HIGH (ref 0–44)
AST: 39 U/L (ref 15–41)
Albumin: 4.8 g/dL (ref 3.5–5.0)
Alkaline Phosphatase: 43 U/L (ref 38–126)
Bilirubin, Direct: 0.1 mg/dL (ref 0.0–0.2)
Indirect Bilirubin: 0.5 mg/dL (ref 0.3–0.9)
Total Bilirubin: 0.6 mg/dL (ref <1.2)
Total Protein: 7.6 g/dL (ref 6.5–8.1)

## 2023-03-13 LAB — LIPASE, BLOOD: Lipase: 25 U/L (ref 11–51)

## 2023-03-13 LAB — TROPONIN I (HIGH SENSITIVITY): Troponin I (High Sensitivity): 3 ng/L (ref <18)

## 2023-03-13 MED ORDER — HYOSCYAMINE SULFATE 0.125 MG SL SUBL
0.2500 mg | SUBLINGUAL_TABLET | Freq: Once | SUBLINGUAL | Status: AC
  Administered 2023-03-13: 0.25 mg via SUBLINGUAL
  Filled 2023-03-13: qty 2

## 2023-03-13 MED ORDER — ALUM & MAG HYDROXIDE-SIMETH 200-200-20 MG/5ML PO SUSP
30.0000 mL | Freq: Once | ORAL | Status: AC
  Administered 2023-03-13: 30 mL via ORAL
  Filled 2023-03-13: qty 30

## 2023-03-13 MED ORDER — PANTOPRAZOLE SODIUM 40 MG PO TBEC: 40.0000 mg | DELAYED_RELEASE_TABLET | Freq: Every day | ORAL | 1 refills | Status: DC

## 2023-03-13 NOTE — ED Triage Notes (Signed)
Pt reports non-radiating left sided throbbing sharp chest pain x 2 weeks but it has now become a constant pain. He denies shortness of breath but did vomit twice last week.

## 2023-03-13 NOTE — Discharge Instructions (Addendum)
You were seen in the emergency room for chest pain.  Overall your workup is reassuring.  Please follow-up with primary care to ensure resolution of symptoms.  Please return to emergency room if you have any new or worsening symptoms.    I send Protonix to pharmacy to take daily for acid reflux.  Please follow-up with primary care to ensure that this improves her symptoms.  You also have elevated liver enzyme your ALT is 60 recommend rechecking labs with primary care.

## 2023-03-13 NOTE — ED Provider Notes (Signed)
[place] [facility] AT [facility] Provider Note   CSN: [id number] Arrival date & time: 03/13/23  1550     History  Chief Complaint  Patient presents with   Chest Pain    Steve Wolfe is a 42 y.o. male patient with past medical history of a stroke presenting to emergency room with 2 weeks of chest pain.  Patient reports his chest pain is located on the left upper side of chest.  Patient reports pain is worse with palpation or movement.  Patient does not feel that pain is worse with taking deep breath then.  Patient has noticed that sometimes after he eats he gets a burning discomfort in his chest.  Patient describes current pain as burning.  Patient is tolerating p.o. intake however he did vomit twice last week he has not had any episodes of nausea or vomiting this week.  Notes no shortness of breath associated with symptoms no palpitations or fluttering no change in vision or blurry vision or focal neurological deficits.  Patient denies IV drug use, reports occasional EtOH use.   Chest Pain      Home Medications Prior to Admission medications   Medication Sig Start Date End Date Taking? Authorizing Provider  cephALEXin (KEFLEX) 500 MG capsule Take 1 capsule (500 mg total) by mouth 4 (four) times daily. Patient not taking: Reported on 10/06/2020 05/24/17   Lenda Kelp, MD  cholecalciferol (VITAMIN D3) 25 MCG (1000 UNIT) tablet Take 1,000 Units by mouth daily.    [provider]  HYDROcodone-acetaminophen (NORCO) 5-325 MG tablet Take 1 tablet by mouth every 6 (six) hours as needed for moderate pain. Patient not taking: Reported on 10/06/2020 05/24/17   Lenda Kelp, MD  ibuprofen (ADVIL,MOTRIN) 800 MG tablet Take 1 tablet (800 mg total) by mouth 3 (three) times daily. Patient not taking: Reported on 10/06/2020 05/25/17   Palumbo, April, MD  sulfamethoxazole-trimethoprim (BACTRIM DS,SEPTRA DS) 800-160 MG tablet Take 2 tablets by mouth 2 (two) times  daily. Patient not taking: Reported on 10/06/2020 05/30/17   Lenda Kelp, MD      Allergies    Patient has no known allergies.    Review of Systems   Review of Systems  Cardiovascular:  Positive for chest pain.    Physical Exam Updated Vital Signs BP (!) 149/108 (BP Location: Left Arm)   Pulse 87   Temp 97.7 F (36.5 C)   Resp 20   Ht 6' (1.829 m)   Wt 118.4 kg   SpO2 95%   BMI 35.40 kg/m  Physical Exam Vitals and nursing note reviewed.  Constitutional:      General: He is not in acute distress.    Appearance: He is not toxic-appearing.  HENT:     Head: Normocephalic and atraumatic.  Eyes:     General: No scleral icterus.    Conjunctiva/sclera: Conjunctivae normal.  Cardiovascular:     Rate and Rhythm: Normal rate and regular rhythm.     Pulses: Normal pulses.     Heart sounds: Normal heart sounds.     Comments: Patient has area of pain over her left lower chest as well as pain over right lower rib.  Denies any recent traumas falls.  No area of ecchymosis or rash over this area. Pulmonary:     Effort: Pulmonary effort is normal. No respiratory distress.     Breath sounds: Normal breath sounds.  Abdominal:     General: Abdomen is flat. Bowel sounds  are normal.     Palpations: Abdomen is soft.     Tenderness: There is no abdominal tenderness.     Comments: Patient is no focal tenderness on abdominal exam.  No area of mass or distention.  Musculoskeletal:     Right lower leg: No edema.     Left lower leg: No edema.     Comments: No calf tenderness or swelling on exam.  No findings consistent with DVT neurovascularly intact.  Skin:    General: Skin is warm and dry.     Findings: No lesion.  Neurological:     General: No focal deficit present.     Mental Status: He is alert and oriented to person, place, and time. Mental status is at baseline.     ED Results / Procedures / Treatments   Labs (all labs ordered are listed, but only abnormal results are  displayed) Labs Reviewed  BASIC METABOLIC PANEL - Abnormal; Notable for the following components:      Result Value   Glucose, Bld 121 (*)    All other components within normal limits  HEPATIC FUNCTION PANEL - Abnormal; Notable for the following components:   ALT 60 (*)    All other components within normal limits  CBC  LIPASE, BLOOD  TROPONIN I (HIGH SENSITIVITY)    EKG EKG Interpretation Date/Time:  Monday March 13 2023 15:57:54 EST Ventricular Rate:  81 PR Interval:  170 QRS Duration:  84 QT Interval:  348 QTC Calculation: 404 R Axis:   127  Text Interpretation: Normal sinus rhythm Possible Right ventricular hypertrophy Abnormal ECG When compared with ECG of 07-Sep-2019 02:19, PREVIOUS ECG IS PRESENT Confirmed by Virgina Norfolk [phone]) on 03/13/2023 4:04:11 PM  Radiology DG Chest 2 View  Result Date: 03/13/2023 CLINICAL DATA:  Chest pain for 1.5 weeks. History of high blood pressure. EXAM: CHEST - 2 VIEW COMPARISON:  Chest radiographs 03/13/2018 FINDINGS: Cardiac silhouette and mediastinal contours are within normal limits. The lungs are clear. No pleural effusion or pneumothorax. Mild multilevel degenerative disc changes of the thoracic spine. IMPRESSION: No active cardiopulmonary disease. Electronically Signed   By: Neita Garnet M.D.   On: 03/13/2023 17:07    Procedures Procedures    Medications Ordered in ED Medications - No data to display  ED Course/ Medical Decision Making/ A&P                                 Medical Decision Making Amount and/or Complexity of Data Reviewed Labs: ordered. Radiology: ordered.  Risk OTC drugs. Prescription drug management.   [facility] 42 y.o. presented today for chest pain. Working DDx that I considered at this time includes, but not limited to, ACS, GERD, pe, pna, aortic dissection, pneumothorax, MSK path, anemia, esophageal rupture, CHF exacerbation, valvular disorder, myocarditis, pericarditis, endocarditis,  pericardial effusion/cardiac tamponade, pulmonary edema, gastritis/PUD, esophagitis.  R/o Dx: These are considered less likely due to history of present illness and physical exam findings.  PE: advanced imaging will not be ordered as alternative diagnoses are more likely at this time Aortic Dissection: less likely based on the history of present illness - location, quality, onset, and severity of symptoms in this case.   Review of prior external notes: None   Unique Tests and My Interpretation:  EKG: Rate, rhythm, axis, intervals all examined: Sinus rhythm with possible right ventricular hypertrophy Troponin: 3, will not obtain troponin as patient reports he  has had constant chest pain that is been ongoing all day & for several weeks  CXR: No active cardiopulmonary disease.  CBC: No leukocytosis and no anemia BMP: BG 121 otherwise no abnormality no AKI  Hepatic function: ALT 60, otherwise unremarkable -discussed finding with patient and recommended outpatient recheck of labs patient agrees and understands Lipase: 25  Problem List / ED Course / Critical interventions / Medication management  Reporting with chest pain for 2 weeks.  Patient reports chest pain is burning and worse with lying flat.  Patient symptoms would be very atypical for ACS and has had negative troponin as well as EKG without ST elevation.  Patient's chest pain is not radiating or does have qualities consistent with an aortic dissection.  Patient is PERC negative with no shortness of breath and no sign of DVT on exam thus I doubt PE as cause of chest pain.  Patient has no trauma fall and has chest x-ray without any sign of rib fracture.  No URI symptoms or cough consistent with pneumonia or bronchitis.  Patient reports he recently see primary care for the same thing and diagnosed with GERD.  Has not been taking any medication for this. Patient hemodynamically stable and well-appearing has no abdominal pain on exam.  Chest pain  resolved after GI cocktail.  Reports good follow-up.  Discussed strict return precautions. I ordered medication including Gi cocktail Reevaluation of the patient after these medicines showed that the patient improved Patients vitals assessed. Upon arrival patient is hemodynamically stable.  I have reviewed the patients home medicines and have made adjustments as needed     Plan:  F/u w/ PCP to ensure resolution of sx.  Patient was given return precautions. Patient stable for discharge at this time.  Patient educated on sx/ dx and verbalized understanding of plan.  Will return to ER w/ new or worsening sx.          Final Clinical Impression(s) / ED Diagnoses Final diagnoses:  Atypical chest pain  Gastroesophageal reflux disease, unspecified whether esophagitis present    Rx / DC Orders ED Discharge Orders     None         Smitty Knudsen, PA-C 03/13/23 1859    Virgina Norfolk, DO 03/13/23 2231

## 2023-06-11 ENCOUNTER — Encounter (HOSPITAL_BASED_OUTPATIENT_CLINIC_OR_DEPARTMENT_OTHER): Payer: Self-pay | Admitting: Emergency Medicine

## 2023-06-11 ENCOUNTER — Emergency Department (HOSPITAL_BASED_OUTPATIENT_CLINIC_OR_DEPARTMENT_OTHER): Payer: No Typology Code available for payment source

## 2023-06-11 ENCOUNTER — Other Ambulatory Visit: Payer: Self-pay

## 2023-06-11 ENCOUNTER — Observation Stay (HOSPITAL_BASED_OUTPATIENT_CLINIC_OR_DEPARTMENT_OTHER)
Admission: EM | Admit: 2023-06-11 | Discharge: 2023-06-12 | Disposition: A | Discharge status: Home / Self Care | Payer: No Typology Code available for payment source | Attending: Emergency Medicine | Admitting: Emergency Medicine

## 2023-06-11 DIAGNOSIS — R82998 Other abnormal findings in urine: Secondary | ICD-10-CM

## 2023-06-11 DIAGNOSIS — R791 Abnormal coagulation profile: Secondary | ICD-10-CM | POA: Insufficient documentation

## 2023-06-11 DIAGNOSIS — R8271 Bacteriuria: Secondary | ICD-10-CM | POA: Insufficient documentation

## 2023-06-11 DIAGNOSIS — Z79899 Other long term (current) drug therapy: Secondary | ICD-10-CM | POA: Diagnosis not present

## 2023-06-11 DIAGNOSIS — I214 Non-ST elevation (NSTEMI) myocardial infarction: Secondary | ICD-10-CM | POA: Diagnosis not present

## 2023-06-11 DIAGNOSIS — K219 Gastro-esophageal reflux disease without esophagitis: Secondary | ICD-10-CM | POA: Diagnosis not present

## 2023-06-11 DIAGNOSIS — R7989 Other specified abnormal findings of blood chemistry: Secondary | ICD-10-CM

## 2023-06-11 DIAGNOSIS — R55 Syncope and collapse: Principal | ICD-10-CM

## 2023-06-11 DIAGNOSIS — R29898 Other symptoms and signs involving the musculoskeletal system: Secondary | ICD-10-CM

## 2023-06-11 DIAGNOSIS — D72829 Elevated white blood cell count, unspecified: Secondary | ICD-10-CM | POA: Diagnosis not present

## 2023-06-11 DIAGNOSIS — E119 Type 2 diabetes mellitus without complications: Secondary | ICD-10-CM | POA: Insufficient documentation

## 2023-06-11 DIAGNOSIS — Z7982 Long term (current) use of aspirin: Secondary | ICD-10-CM | POA: Insufficient documentation

## 2023-06-11 DIAGNOSIS — G459 Transient cerebral ischemic attack, unspecified: Principal | ICD-10-CM | POA: Diagnosis present

## 2023-06-11 DIAGNOSIS — R531 Weakness: Secondary | ICD-10-CM

## 2023-06-11 LAB — RAPID URINE DRUG SCREEN, HOSP PERFORMED
Amphetamines: NOT DETECTED
Barbiturates: NOT DETECTED
Benzodiazepines: NOT DETECTED
Cocaine: NOT DETECTED
Opiates: NOT DETECTED
Tetrahydrocannabinol: NOT DETECTED

## 2023-06-11 LAB — CBC
HCT: 42.3 % (ref 39.0–52.0)
Hemoglobin: 14.5 g/dL (ref 13.0–17.0)
MCH: 29 pg (ref 26.0–34.0)
MCHC: 34.3 g/dL (ref 30.0–36.0)
MCV: 84.6 fL (ref 80.0–100.0)
Platelets: 250 10*3/uL (ref 150–400)
RBC: 5 MIL/uL (ref 4.22–5.81)
RDW: 13.4 % (ref 11.5–15.5)
WBC: 6.9 10*3/uL (ref 4.0–10.5)
nRBC: 0 % (ref 0.0–0.2)

## 2023-06-11 LAB — COMPREHENSIVE METABOLIC PANEL
ALT: 48 U/L — ABNORMAL HIGH (ref 0–44)
AST: 30 U/L (ref 15–41)
Albumin: 4.3 g/dL (ref 3.5–5.0)
Alkaline Phosphatase: 55 U/L (ref 38–126)
Anion gap: 10 (ref 5–15)
BUN: 14 mg/dL (ref 6–20)
CO2: 24 mmol/L (ref 22–32)
Calcium: 9.2 mg/dL (ref 8.9–10.3)
Chloride: 102 mmol/L (ref 98–111)
Creatinine, Ser: 1.07 mg/dL (ref 0.61–1.24)
GFR, Estimated: >60 mL/min (ref >60)
Glucose, Bld: 113 mg/dL — ABNORMAL HIGH (ref 70–99)
Potassium: 3.5 mmol/L (ref 3.5–5.1)
Sodium: 136 mmol/L (ref 135–145)
Total Bilirubin: 0.9 mg/dL (ref 0.0–1.2)
Total Protein: 7.2 g/dL (ref 6.5–8.1)

## 2023-06-11 LAB — URINALYSIS, MICROSCOPIC (REFLEX)

## 2023-06-11 LAB — HEMOGLOBIN A1C
Hgb A1c MFr Bld: 7.3 % — ABNORMAL HIGH (ref 4.8–5.6)
Mean Plasma Glucose: 162.81 mg/dL

## 2023-06-11 LAB — URINALYSIS, ROUTINE W REFLEX MICROSCOPIC
Bilirubin Urine: NEGATIVE
Glucose, UA: NEGATIVE mg/dL
Ketones, ur: NEGATIVE mg/dL
Nitrite: NEGATIVE
Protein, ur: NEGATIVE mg/dL
Specific Gravity, Urine: 1.01 (ref 1.005–1.030)
pH: 7 (ref 5.0–8.0)

## 2023-06-11 LAB — D-DIMER, QUANTITATIVE: D-Dimer, Quant: 0.92 ug{FEU}/mL — ABNORMAL HIGH (ref 0.00–0.50)

## 2023-06-11 LAB — TROPONIN I (HIGH SENSITIVITY)
Troponin I (High Sensitivity): 32 ng/L — ABNORMAL HIGH (ref <18)
Troponin I (High Sensitivity): 48 ng/L — ABNORMAL HIGH (ref <18)

## 2023-06-11 LAB — CBG MONITORING, ED: Glucose-Capillary: 119 mg/dL — ABNORMAL HIGH (ref 70–99)

## 2023-06-11 MED ORDER — SODIUM CHLORIDE 0.9 % IV SOLN: INTRAVENOUS | Status: AC | PRN

## 2023-06-11 MED ORDER — ACETAMINOPHEN 325 MG PO TABS
650.0000 mg | ORAL_TABLET | Freq: Four times a day (QID) | ORAL | Status: DC | PRN
  Administered 2023-06-11: 650 mg via ORAL
  Filled 2023-06-11: qty 2

## 2023-06-11 MED ORDER — ENOXAPARIN SODIUM 40 MG/0.4ML IJ SOSY
40.0000 mg | PREFILLED_SYRINGE | INTRAMUSCULAR | Status: DC
  Administered 2023-06-12: 40 mg via SUBCUTANEOUS
  Filled 2023-06-11: qty 0.4

## 2023-06-11 MED ORDER — SODIUM CHLORIDE 0.9 % IV BOLUS
1000.0000 mL | Freq: Once | INTRAVENOUS | Status: AC
  Administered 2023-06-11: 1000 mL via INTRAVENOUS

## 2023-06-11 MED ORDER — SODIUM CHLORIDE 0.9 % IV SOLN
1.0000 g | Freq: Once | INTRAVENOUS | Status: AC
  Administered 2023-06-11: 1 g via INTRAVENOUS
  Filled 2023-06-11: qty 10

## 2023-06-11 MED ORDER — PANTOPRAZOLE SODIUM 40 MG PO TBEC
40.0000 mg | DELAYED_RELEASE_TABLET | Freq: Every day | ORAL | Status: DC
Start: 2023-06-12 — End: 2023-06-13
  Administered 2023-06-12: 40 mg via ORAL
  Filled 2023-06-11: qty 1

## 2023-06-11 MED ORDER — ACETAMINOPHEN 650 MG RE SUPP: 650.0000 mg | Freq: Four times a day (QID) | RECTAL | Status: DC | PRN

## 2023-06-11 MED ORDER — CEFUROXIME AXETIL 500 MG PO TABS: 500.0000 mg | ORAL_TABLET | Freq: Two times a day (BID) | ORAL | 0 refills | Status: AC

## 2023-06-11 MED ORDER — SODIUM CHLORIDE 0.9% FLUSH
3.0000 mL | Freq: Two times a day (BID) | INTRAVENOUS | Status: DC
  Administered 2023-06-11 – 2023-06-12 (×2): 3 mL via INTRAVENOUS

## 2023-06-11 MED ORDER — IOHEXOL 350 MG/ML SOLN
75.0000 mL | Freq: Once | INTRAVENOUS | Status: AC | PRN
  Administered 2023-06-11: 75 mL via INTRAVENOUS

## 2023-06-11 NOTE — ED Notes (Signed)
 Pt eating PB crackers and drinking water.

## 2023-06-11 NOTE — Progress Notes (Signed)
 Paged [facility] admitting of patient's arrival to unit from [facility]

## 2023-06-11 NOTE — ED Notes (Addendum)
 Pt updated on status of transfer.  Pt ambulatory to the bathroom w/ no difficulty.  No changes in vitals were noted after pt walked to the bathroom

## 2023-06-11 NOTE — ED Triage Notes (Signed)
 Pt sts LUE "went limp" at 12pm today; then he felt lightheaded and passed out as he was headed into his house; no neuro deficits present at this time; hx of stroke at age 43 d/t drug use per pt

## 2023-06-11 NOTE — ED Notes (Signed)
 Called [facility] for transport.

## 2023-06-11 NOTE — ED Provider Notes (Signed)
 Patient was initially seen by Dr. West Bali.  Please see his note.  Patient presented to the ED for evaluation of near syncopal episode associated with weakness.  Patient had a workup in the ED including laboratory tests scans and MRI.  No signs of any acute stroke or abnormality on his MRI.  Did have an elevated D-dimer but there is no evidence of PE on CT angiogram.  Patient's troponin however are increasing slightly.  Considering his near syncope weakness elevated troponins we will consult the medical service for possible further workup of syncope TIA type symptoms  Case discussed with Dr Roxanne Gates, MD 06/11/23 [phone]

## 2023-06-11 NOTE — H&P (Addendum)
 History and Physical    PatientCorbet Wolfe XBJ:[id number] DOB: 01-21-1981 DOA: 06/11/2023 DOS: the patient was seen and examined on 06/11/2023 PCP: P[facility], [facility]  Patient coming from: [facility]  Chief Complaint:  Chief Complaint  Patient presents with   Loss of Consciousness   HPI: Steve Wolfe is a 43 y.o. male with history of superior left cerebellar infarct presenting to the ED after experiencing left arm weakness.  Patient reports that he was in his usual state of health until earlier this morning.  Around 11 AM, he was sitting in his car when he developed sudden onset left arm weakness and lightheadedness/dizziness.  He was on the phone while this happened.  He was able to get out of the car and walk towards his home.  He continued to notice left arm weakness and lightheadedness/dizziness until he got to his home.  Before getting to his steps, he felt like he was going to faint and he subsequently fell towards his left side.  He did not lose consciousness and was able to get up and get inside of his home.  He states that his symptoms ultimately resolved within 15 minutes.  He denies any blurry vision, slurred speech, nausea, vomiting, fevers, chills, chest pain, palpitations, shortness of breath, cough, abdominal pain, dysuria, urinary frequency.  ED course: Vital signs stable.  Blood glucose 119 on arrival to the ED.  CBC unremarkable.  CMP with mild hyperglycemia, otherwise unremarkable.  Urine drug screen negative.  Urinalysis with trace leukocytes, 11-20 WBCs, many bacteria though patient denies any urinary complaints.  Troponin mildly elevated at 32, repeat at 48.  D-dimer mildly elevated.  MRI brain with no acute intracranial abnormalities but showing left cerebellar encephalomalacia.  CT angio chest negative for PE.  [facility] hospitalist asked to evaluate patient for admission.  Review of Systems: As mentioned in the history of present illness. All other systems  reviewed and are negative. Past Medical History:  Diagnosis Date   Stroke [facility])    History reviewed. No pertinent surgical history. Social History:  reports that he has never smoked. He has never used smokeless tobacco. He reports current alcohol use. He reports that he does not currently use drugs.  No Known Allergies  History reviewed. No pertinent family history.  Prior to Admission medications   Medication Sig Start Date End Date Taking? Authorizing Provider  cefUROXime (CEFTIN) 500 MG tablet Take 1 tablet (500 mg total) by mouth 2 (two) times daily with a meal for 10 days. 06/11/23 06/21/23 Yes Linwood Dibbles, MD  cholecalciferol (VITAMIN D3) 25 MCG (1000 UNIT) tablet Take 1,000 Units by mouth daily.    [provider]  pantoprazole (PROTONIX) 40 MG tablet Take 1 tablet (40 mg total) by mouth daily. 03/13/23   Smitty Knudsen, PA-C    Physical Exam: Vitals:   06/11/23 2110 06/11/23 2130 06/11/23 2224 06/11/23 2327  BP: (!) 123/92 (!) 126/92 (!) 135/97 (!) 126/90  Pulse: 86 80 75 72  Resp: 18 (!) 22 16 18   Temp:   98.1 F (36.7 C) 97.9 F (36.6 C)  TempSrc:   Oral Oral  SpO2: 97% 96% 98% 96%  Weight:      Height:       Physical Exam Constitutional:      Appearance: He is obese. He is not ill-appearing.  HENT:     Head: Normocephalic and atraumatic.     Mouth/Throat:     Mouth: Mucous membranes are  moist.     Pharynx: Oropharynx is clear. No oropharyngeal exudate.  Eyes:     General: No scleral icterus.    Extraocular Movements: Extraocular movements intact.     Conjunctiva/sclera: Conjunctivae normal.     Pupils: Pupils are equal, round, and reactive to light.  Cardiovascular:     Rate and Rhythm: Normal rate and regular rhythm.     Pulses: Normal pulses.     Heart sounds: Normal heart sounds. No murmur heard.    No friction rub. No gallop.  Pulmonary:     Effort: Pulmonary effort is normal.     Breath sounds: Normal breath sounds. No wheezing, rhonchi or  rales.  Abdominal:     General: Bowel sounds are normal. There is no distension.     Palpations: Abdomen is soft.     Tenderness: There is no abdominal tenderness. There is no guarding or rebound.  Musculoskeletal:        General: No swelling. Normal range of motion.     Cervical back: Normal range of motion.  Skin:    General: Skin is warm and dry.  Neurological:     General: No focal deficit present.     Mental Status: He is alert and oriented to person, place, and time.     Comments: PERRL, EOMI. visual fields full.  Facial sensation and muscles intact. Hearing to voice intact. Uvula midline.  Shoulder shrug symmetric.  Tongue protrusion midline.  Sensation grossly intact bilaterally.  Strength 5 out of 5 in all extremities.  FNF testing and HSH testing intact bilaterally.  No dysdiadochokinesia noted.  Psychiatric:        Mood and Affect: Mood normal.        Behavior: Behavior normal.     Data Reviewed:  There are no new results to review at this time.    Latest Ref Rng & Units 06/11/2023    1:07 PM 03/13/2023    3:56 PM 09/01/2020    9:32 PM  CBC  WBC 4.0 - 10.5 K/uL 6.9  5.5  7.1   Hemoglobin 13.0 - 17.0 g/dL 16.1  09.6  04.5   Hematocrit 39.0 - 52.0 % 42.3  43.4  44.5   Platelets 150 - 400 K/uL 250  217  221       Latest Ref Rng & Units 06/11/2023    1:07 PM 03/13/2023    4:04 PM 03/13/2023    3:56 PM  CMP  Glucose 70 - 99 mg/dL 409   811   BUN 6 - 20 mg/dL 14   14   Creatinine 9.14 - 1.24 mg/dL 7.82   9.56   Sodium 213 - 145 mmol/L 136   137   Potassium 3.5 - 5.1 mmol/L 3.5   3.6   Chloride 98 - 111 mmol/L 102   101   CO2 22 - 32 mmol/L 24   24   Calcium 8.9 - 10.3 mg/dL 9.2   08.6   Total Protein 6.5 - 8.1 g/dL 7.2  7.6    Total Bilirubin 0.0 - 1.2 mg/dL 0.9  0.6    Alkaline Phos 38 - 126 U/L 55  43    AST 15 - 41 U/L 30  39    ALT 0 - 44 U/L 48  60     CT Angio Chest PE W and/or Wo Contrast Result Date: 06/11/2023 CLINICAL DATA:  Pulmonary embolism (PE)  suspected, low to intermediate prob, positive D-dimer EXAM: CT ANGIOGRAPHY CHEST  WITH CONTRAST TECHNIQUE: Multidetector CT imaging of the chest was performed using the standard protocol during bolus administration of intravenous contrast. Multiplanar CT image reconstructions and MIPs were obtained to evaluate the vascular anatomy. RADIATION DOSE REDUCTION: This exam was performed according to the departmental dose-optimization program which includes automated exposure control, adjustment of the mA and/or kV according to patient size and/or use of iterative reconstruction technique. CONTRAST:  75mL OMNIPAQUE IOHEXOL 350 MG/ML SOLN COMPARISON:  X-ray 03/13/2023 FINDINGS: Cardiovascular: Satisfactory opacification of the pulmonary arteries to the segmental level. No evidence of pulmonary embolism. Thoracic aorta is normal in course and caliber. Normal heart size. No pericardial effusion. Mediastinum/Nodes: No enlarged mediastinal, hilar, or axillary lymph nodes. Thyroid gland, trachea, and esophagus demonstrate no significant findings. Lungs/Pleura: Shallow inspiration. Both lungs are clear. No pleural effusion or pneumothorax. Upper Abdomen: No acute abnormality. Musculoskeletal: No chest wall abnormality. No acute or significant osseous findings. Review of the MIP images confirms the above findings. IMPRESSION: No evidence of pulmonary embolism or other acute intrathoracic process. Electronically Signed   By: Duanne Guess D.O.   On: 06/11/2023 17:51   MR BRAIN WO CONTRAST Result Date: 06/11/2023 CLINICAL DATA:  Neuro deficit, acute, stroke suspected left arm/left sided weakness EXAM: MRI HEAD WITHOUT CONTRAST TECHNIQUE: Multiplanar, multiecho pulse sequences of the brain and surrounding structures were obtained without intravenous contrast. COMPARISON:  MRI head Sep 02, 2020. FINDINGS: Brain: No acute infarction, hemorrhage, hydrocephalus, extra-axial collection or mass lesion. Similar left cerebellar  encephalomalacia. Vascular: Major arterial flow voids are maintained at the skull base. Skull and upper cervical spine: Normal marrow signal. Sinuses/Orbits: Clear sinuses.  No acute orbital findings. Other: No mastoid effusions. IMPRESSION: 1. No acute intracranial abnormality. 2. Similar left cerebellar encephalomalacia. Electronically Signed   By: Feliberto Harts M.D.   On: 06/11/2023 15:26    Assessment and Plan: No notes have been filed under this hospital service. Service: Hospitalist  TIA History of superior left cerebellar infarct Patient presenting after experiencing a 15-minute episode of transient left upper extremity weakness and lightheadedness/dizziness.  Based on his history, this does appear to be a transient ischemic attack.  He has a prior history of a cerebellar infarct that was noted to be subacute or chronic on a prior MRI brain in May 2022 with today's MRI showing left cerebellar encephalomalacia but no acute stroke.  It is possible that he may have had recrudescence of his prior stroke though he does not recall experiencing any symptoms in the past. However, will need to treat as TIA and perform workup for this. EKG with sinus tachycardia but no obvious ischemic changes. As aforementioned, MRI without acute stroke noted. Will obtain ECHO, carotid dopplers, lipid panel. Will also start him on aspirin monotherapy (ABCD2 score <4) for secondary prevention. Fortunately, his neurological exam is without any notable abnormalities. -Aspirin 325mg  once, then ASA 81mg  daily from tomorrow -f/u ECHO, carotid dopplers -f/u lipid panel, will likely need statin at discharge -f/u orthostatic vitals -A1c 7.3%, no prior history of diabetes -telemetry -carb-modified diet -PT eval -lovenox for dvt ppx -place in observation/telemetry  Type 2 NSTEMI -troponins elevated 32 > 48 -EKG with sinus tachycardia but no obvious ischemic changes -likely demand ischemia in the setting of TIA and  sinus tachycardia -trend troponins until peak  New diagnosis of T2DM Patient with no prior history of T2DM. Blood glucose elevated at 113 on CMP today. -hgb A1c 7.3% -trend CBGs, goal 140-180 -consider adding SSI if CBGs above goal -will need  oral regimen at discharge and appropriate outpatient follow up (reports he has PCP in [place])  Asymptomatic bacteriuria -patient without any urinary complaints -UA with trace leukocytes, many bacteria, 11-20 WBCs -urine culture ordered by EDP -one dose of rocephin provided in ED, will not continue given lack of symptoms  Elevated d-dimer -unclear etiology -CT PE study negative for PE  GERD -continue protonix   Advance Care Planning:   Code Status: Full Code   Consults: none  Family Communication: updated family at bedside  Severity of Illness: The appropriate patient status for this patient is OBSERVATION. Observation status is judged to be reasonable and necessary in order to provide the required intensity of service to ensure the patient's safety. The patient's presenting symptoms, physical exam findings, and initial radiographic and laboratory data in the context of their medical condition is felt to place them at decreased risk for further clinical deterioration. Furthermore, it is anticipated that the patient will be medically stable for discharge from the hospital within 2 midnights of admission.   Portions of this note were generated with Scientist, clinical (histocompatibility and immunogenetics). Dictation errors may occur despite best attempts at proofreading.   Author: Briscoe Burns, MD 06/11/2023 11:49 PM  For on call review [email or web address].

## 2023-06-11 NOTE — ED Notes (Signed)
 Patient transported to MRI via WC

## 2023-06-11 NOTE — ED Notes (Signed)
Pt laid flat for orthostatics 

## 2023-06-11 NOTE — ED Notes (Signed)
 Assumed care of pt, found him alert and oriented.  NO neuro deficits at this time.  Pt updated to inpatient bed status and signed consent for tranfer.  No other needs identified at this time.

## 2023-06-11 NOTE — ED Provider Notes (Signed)
 [place] [facility] Provider Note   CSN: [id number] Arrival date & time: 06/11/23  1221     History  Chief Complaint  Patient presents with   Loss of Consciousness    Steve Wolfe is a 43 y.o. male.  Pt indicates was in vehicle when he felt generally weak, as if may faint, and left arm felt weak, as if he could not use it normally (indicates ?hx previously being told prior stroke, so was concerned). Despite left arm feeling weak, he indicates was able to use arm to get out of vehicle, walks towards residence and felt as if may pass out, went down to knees, no loc - was able to get up and go inside, but continued to feel weak/faint. No palpitations or sense of rapid or slow heart beating. Denies any chest pain or discomfort with either this event or previously.  No sob or unusual doe. Currently denies any focal numbness or weakness. No change in speech or vision. Denies abd pain or nvd. No dysuria or gu c/o. Denies any recent blood loss, rectal bleeding or melena. No recent change in meds or new meds. No fever or chills. No leg pain or swelling. No headache. No personal hx cad. No hx dvt or pe. No fam hx premature cad.   The history is provided by the patient and medical records.  Loss of Consciousness Associated symptoms: no chest pain, no confusion, no dizziness, no fever, no headaches, no palpitations, no shortness of breath and no vomiting        Home Medications Prior to Admission medications   Medication Sig Start Date End Date Taking? Authorizing Provider  cephALEXin (KEFLEX) 500 MG capsule Take 1 capsule (500 mg total) by mouth 4 (four) times daily. Patient not taking: Reported on 10/06/2020 05/24/17   Lenda Kelp, MD  cholecalciferol (VITAMIN D3) 25 MCG (1000 UNIT) tablet Take 1,000 Units by mouth daily.    [provider]  HYDROcodone-acetaminophen (NORCO) 5-325 MG tablet Take 1 tablet by mouth every 6 (six) hours as needed for  moderate pain. Patient not taking: Reported on 10/06/2020 05/24/17   Lenda Kelp, MD  ibuprofen (ADVIL,MOTRIN) 800 MG tablet Take 1 tablet (800 mg total) by mouth 3 (three) times daily. Patient not taking: Reported on 10/06/2020 05/25/17   Palumbo, April, MD  pantoprazole (PROTONIX) 40 MG tablet Take 1 tablet (40 mg total) by mouth daily. 03/13/23   Barrett, Horald Chestnut, PA-C  sulfamethoxazole-trimethoprim (BACTRIM DS,SEPTRA DS) 800-160 MG tablet Take 2 tablets by mouth 2 (two) times daily. Patient not taking: Reported on 10/06/2020 05/30/17   Lenda Kelp, MD      Allergies    Patient has no known allergies.    Review of Systems   Review of Systems  Constitutional:  Negative for chills and fever.  HENT:  Negative for sore throat and trouble swallowing.   Eyes:  Negative for pain, redness and visual disturbance.  Respiratory:  Negative for cough and shortness of breath.   Cardiovascular:  Positive for syncope. Negative for chest pain, palpitations and leg swelling.  Gastrointestinal:  Negative for abdominal pain, blood in stool, diarrhea and vomiting.  Genitourinary:  Negative for dysuria, flank pain and penile discharge.  Musculoskeletal:  Negative for back pain and neck pain.  Skin:  Negative for rash.  Neurological:  Positive for light-headedness. Negative for dizziness, speech difficulty, numbness and headaches.  Psychiatric/Behavioral:  Negative for confusion.     Physical  Exam Updated Vital Signs BP (!) 137/100 (BP Location: Right Arm)   Pulse 93   Temp 98.2 F (36.8 C)   Resp 20   Ht 1.829 m (6')   Wt 112 kg   SpO2 100%   BMI 33.50 kg/m  Physical Exam Vitals and nursing note reviewed.  Constitutional:      Appearance: Normal appearance. He is well-developed.  HENT:     Head: Atraumatic.     Right Ear: Tympanic membrane normal.     Left Ear: Tympanic membrane normal.     Nose: Nose normal.     Mouth/Throat:     Mouth: Mucous membranes are moist.     Pharynx:  Oropharynx is clear.  Eyes:     General: No scleral icterus.    Conjunctiva/sclera: Conjunctivae normal.     Pupils: Pupils are equal, round, and reactive to light.  Neck:     Vascular: No carotid bruit.     Trachea: No tracheal deviation.  Cardiovascular:     Rate and Rhythm: Normal rate and regular rhythm.     Pulses: Normal pulses.     Heart sounds: Normal heart sounds. No murmur heard.    No friction rub. No gallop.  Pulmonary:     Effort: Pulmonary effort is normal. No accessory muscle usage or respiratory distress.     Breath sounds: Normal breath sounds.  Abdominal:     General: Bowel sounds are normal. There is no distension.     Palpations: Abdomen is soft. There is no mass.     Tenderness: There is no abdominal tenderness. There is no guarding.  Musculoskeletal:        General: No swelling or tenderness.     Cervical back: Normal range of motion and neck supple. No rigidity.     Right lower leg: No edema.     Left lower leg: No edema.     Comments: No leg or calf pain or swelling.   Lymphadenopathy:     Cervical: No cervical adenopathy.  Skin:    General: Skin is warm and dry.     Findings: No rash.  Neurological:     Mental Status: He is alert and oriented to person, place, and time.     Cranial Nerves: No cranial nerve deficit.     Comments: Alert, speech clear. No dysarthria or aphasia. Motor/sens grossly intact bil. Stre 5/5. No pronator drift. Steady gait, no ataxia.   Psychiatric:        Mood and Affect: Mood normal.     ED Results / Procedures / Treatments   Labs (all labs ordered are listed, but only abnormal results are displayed) Results for orders placed or performed during the hospital encounter of 06/11/23  POC CBG, ED   Collection Time: 06/11/23 12:37 PM  Result Value Ref Range   Glucose-Capillary 119 (H) 70 - 99 mg/dL  Urinalysis, Routine w reflex microscopic -Urine, Clean Catch   Collection Time: 06/11/23 12:45 PM  Result Value Ref Range    Color, Urine YELLOW YELLOW   APPearance CLEAR CLEAR   Specific Gravity, Urine 1.010 1.005 - 1.030   pH 7.0 5.0 - 8.0   Glucose, UA NEGATIVE NEGATIVE mg/dL   Hgb urine dipstick TRACE (A) NEGATIVE   Bilirubin Urine NEGATIVE NEGATIVE   Ketones, ur NEGATIVE NEGATIVE mg/dL   Protein, ur NEGATIVE NEGATIVE mg/dL   Nitrite NEGATIVE NEGATIVE   Leukocytes,Ua TRACE (A) NEGATIVE  Rapid urine drug screen (hospital performed)  Collection Time: 06/11/23 12:45 PM  Result Value Ref Range   Opiates NONE DETECTED NONE DETECTED   Cocaine NONE DETECTED NONE DETECTED   Benzodiazepines NONE DETECTED NONE DETECTED   Amphetamines NONE DETECTED NONE DETECTED   Tetrahydrocannabinol NONE DETECTED NONE DETECTED   Barbiturates NONE DETECTED NONE DETECTED  Urinalysis, Microscopic (reflex)   Collection Time: 06/11/23 12:45 PM  Result Value Ref Range   RBC / HPF 0-5 0 - 5 RBC/hpf   WBC, UA 11-20 0 - 5 WBC/hpf   Bacteria, UA MANY (A) NONE SEEN   Squamous Epithelial / HPF 0-5 0 - 5 /HPF  Comprehensive metabolic panel   Collection Time: 06/11/23  1:07 PM  Result Value Ref Range   Sodium 136 135 - 145 mmol/L   Potassium 3.5 3.5 - 5.1 mmol/L   Chloride 102 98 - 111 mmol/L   CO2 24 22 - 32 mmol/L   Glucose, Bld 113 (H) 70 - 99 mg/dL   BUN 14 6 - 20 mg/dL   Creatinine, Ser 1.61 0.61 - 1.24 mg/dL   Calcium 9.2 8.9 - 09.6 mg/dL   Total Protein 7.2 6.5 - 8.1 g/dL   Albumin 4.3 3.5 - 5.0 g/dL   AST 30 15 - 41 U/L   ALT 48 (H) 0 - 44 U/L   Alkaline Phosphatase 55 38 - 126 U/L   Total Bilirubin 0.9 0.0 - 1.2 mg/dL   GFR, Estimated >04 >54 mL/min   Anion gap 10 5 - 15  CBC   Collection Time: 06/11/23  1:07 PM  Result Value Ref Range   WBC 6.9 4.0 - 10.5 K/uL   RBC 5.00 4.22 - 5.81 MIL/uL   Hemoglobin 14.5 13.0 - 17.0 g/dL   HCT 09.8 11.9 - 14.7 %   MCV 84.6 80.0 - 100.0 fL   MCH 29.0 26.0 - 34.0 pg   MCHC 34.3 30.0 - 36.0 g/dL   RDW 82.9 56.2 - 13.0 %   Platelets 250 150 - 400 K/uL   nRBC 0.0 0.0 - 0.2 %   Troponin I (High Sensitivity)   Collection Time: 06/11/23  1:07 PM  Result Value Ref Range   Troponin I (High Sensitivity) 32 (H) <18 ng/L     EKG EKG Interpretation Date/Time:  Sunday June 11 2023 12:29:26 EST Ventricular Rate:  113 PR Interval:  174 QRS Duration:  88 QT Interval:  313 QTC Calculation: 430 R Axis:   167  Text Interpretation: Sinus tachycardia Non-specific ST-t changes Confirmed by Cathren Laine ([id number]) on 06/11/2023 12:36:43 PM  Radiology MR BRAIN WO CONTRAST Result Date: 06/11/2023 CLINICAL DATA:  Neuro deficit, acute, stroke suspected left arm/left sided weakness EXAM: MRI HEAD WITHOUT CONTRAST TECHNIQUE: Multiplanar, multiecho pulse sequences of the brain and surrounding structures were obtained without intravenous contrast. COMPARISON:  MRI head Sep 02, 2020. FINDINGS: Brain: No acute infarction, hemorrhage, hydrocephalus, extra-axial collection or mass lesion. Similar left cerebellar encephalomalacia. Vascular: Major arterial flow voids are maintained at the skull base. Skull and upper cervical spine: Normal marrow signal. Sinuses/Orbits: Clear sinuses.  No acute orbital findings. Other: No mastoid effusions. IMPRESSION: 1. No acute intracranial abnormality. 2. Similar left cerebellar encephalomalacia. Electronically Signed   By: Feliberto Harts M.D.   On: 06/11/2023 15:26    Procedures Procedures    Medications Ordered in ED Medications  0.9 %  sodium chloride infusion ( Intravenous New Bag/Given 06/11/23 1434)  sodium chloride 0.9 % bolus 1,000 mL (0 mLs Intravenous Stopped 06/11/23 1429)  cefTRIAXone (  ROCEPHIN) 1 g in sodium chloride 0.9 % 100 mL IVPB (1 g Intravenous New Bag/Given 06/11/23 1437)    ED Course/ Medical Decision Making/ A&P                                 Medical Decision Making Problems Addressed: Elevated troponin: acute illness or injury Generalized weakness: acute illness or injury with systemic symptoms Left arm weakness:  acute illness or injury with systemic symptoms Near syncope: acute illness or injury with systemic symptoms that poses a threat to life or bodily functions Urine white blood cells increased: acute illness or injury  Amount and/or Complexity of Data Reviewed External Data Reviewed: notes. Labs: ordered. Decision-making details documented in ED Course. Radiology: ordered and independent interpretation performed. Decision-making details documented in ED Course. ECG/medicine tests: ordered and independent interpretation performed. Decision-making details documented in ED Course.  Risk Prescription drug management. Decision regarding hospitalization.   Iv ns. Continuous pulse ox and cardiac monitoring. Labs ordered/sent.   Cbg, glucose normal.   Differential diagnosis includes near syncope, anemia, dehydration, tia, etc. Dispo decision including potential need for admission considered - will get labs and reassess.   Reviewed nursing notes and prior charts for additional history. External reports reviewed.  ?prior mri w ?cva.   Cardiac monitor: sinus rhythm, rate 90.  Labs reviewed/interpreted by me - wbc and hgb normal. UA w 11-20 wbc, bacteria. Will cx and tx.   MRI reviewed/interpreted by me - no acute cva.  Recheck pt, no weakness, no neuro deficit. No chest pain or discomfort. No sob. No faintness.   Additional labs reviewed/interpreted by me - trop mildly high (on prior check was normal). Patient continues to deny any current or recent chest pain.   As trop elev w prior episode, signed out to Dr Lynelle Doctor to check delta trop and ddimer that are pending, recheck patient and dispo appropriately (if delta trop elev/increasing, would admit, if ddimer high, will need additional imaging).           Final Clinical Impression(s) / ED Diagnoses Final diagnoses:  Near syncope  Urine white blood cells increased    Rx / DC Orders ED Discharge Orders     None         Cathren Laine, MD 06/11/23 [phone]

## 2023-06-11 NOTE — H&P (Incomplete)
 History and Physical    PatientLemond Wolfe ZOX:[id number] DOB: May 07, 1980 DOA: 06/11/2023 DOS: the patient was seen and examined on 06/11/2023 PCP: Pc, [facility]  Patient coming from: [facility]  Chief Complaint:  Chief Complaint  Patient presents with  . Loss of Consciousness   HPI: Steve Wolfe is a 43 y.o. male with history of superior left cerebellar infarct presenting to the ED after experiencing left arm weakness.  Patient reports that he was in his usual state of health until earlier this morning.  Around 11 AM, he was sitting in his car when he developed sudden onset left arm weakness and lightheadedness/dizziness.  He was on the phone while this happened.  He was able to get out of the car and walk towards his home.  He continued to notice left arm weakness and lightheadedness/dizziness until he got to his home.  Before getting to his steps, he felt like he was going to faint and he subsequently fell towards his left side.  He did not lose consciousness and was able to get up and get inside of his home.  He states that his symptoms ultimately resolved within 15 minutes.  He denies any blurry vision, slurred speech, nausea, vomiting, fevers, chills, chest pain, palpitations, shortness of breath, cough, abdominal pain, dysuria, urinary frequency.  ED course: Vital signs stable.  Blood glucose 119 on arrival to the ED.  CBC unremarkable.  CMP with mild hyperglycemia, otherwise unremarkable.  Urine drug screen negative.  Urinalysis with trace leukocytes, 11-20 WBCs, many bacteria though patient denies any urinary complaints.  Troponin mildly elevated at 32, repeat at 48.  D-dimer mildly elevated.  MRI brain with no acute intracranial abnormalities but showing left cerebellar encephalomalacia.  CT angio chest negative for PE.  [facility] hospitalist asked to evaluate patient for admission.  Review of Systems: As mentioned in the history of present illness. All other systems  reviewed and are negative. Past Medical History:  Diagnosis Date  . Stroke [facility])    History reviewed. No pertinent surgical history. Social History:  reports that he has never smoked. He has never used smokeless tobacco. He reports current alcohol use. He reports that he does not currently use drugs.  No Known Allergies  History reviewed. No pertinent family history.  Prior to Admission medications   Medication Sig Start Date End Date Taking? Authorizing Provider  cefUROXime (CEFTIN) 500 MG tablet Take 1 tablet (500 mg total) by mouth 2 (two) times daily with a meal for 10 days. 06/11/23 06/21/23 Yes Linwood Dibbles, MD  cholecalciferol (VITAMIN D3) 25 MCG (1000 UNIT) tablet Take 1,000 Units by mouth daily.    [provider]  pantoprazole (PROTONIX) 40 MG tablet Take 1 tablet (40 mg total) by mouth daily. 03/13/23   Smitty Knudsen, PA-C    Physical Exam: Vitals:   06/11/23 2110 06/11/23 2130 06/11/23 2224 06/11/23 2327  BP: (!) 123/92 (!) 126/92 (!) 135/97 (!) 126/90  Pulse: 86 80 75 72  Resp: 18 (!) 22 16 18   Temp:   98.1 F (36.7 C) 97.9 F (36.6 C)  TempSrc:   Oral Oral  SpO2: 97% 96% 98% 96%  Weight:      Height:       Physical Exam Constitutional:      Appearance: He is obese. He is not ill-appearing.  HENT:     Head: Normocephalic and atraumatic.     Mouth/Throat:     Mouth: Mucous membranes are  moist.     Pharynx: Oropharynx is clear. No oropharyngeal exudate.  Eyes:     General: No scleral icterus.    Extraocular Movements: Extraocular movements intact.     Conjunctiva/sclera: Conjunctivae normal.     Pupils: Pupils are equal, round, and reactive to light.  Cardiovascular:     Rate and Rhythm: Normal rate and regular rhythm.     Pulses: Normal pulses.     Heart sounds: Normal heart sounds. No murmur heard.    No friction rub. No gallop.  Pulmonary:     Effort: Pulmonary effort is normal.     Breath sounds: Normal breath sounds. No wheezing, rhonchi or  rales.  Abdominal:     General: Bowel sounds are normal. There is no distension.     Palpations: Abdomen is soft.     Tenderness: There is no abdominal tenderness. There is no guarding or rebound.  Musculoskeletal:        General: No swelling. Normal range of motion.     Cervical back: Normal range of motion.  Skin:    General: Skin is warm and dry.  Neurological:     General: No focal deficit present.     Mental Status: He is alert and oriented to person, place, and time.     Comments: PERRL, EOMI. visual fields full.  Facial sensation and muscles intact. Hearing to voice intact. Uvula midline.  Shoulder shrug symmetric.  Tongue protrusion midline.  Sensation grossly intact bilaterally.  Strength 5 out of 5 in all extremities.  FNF testing and HSH testing intact bilaterally.  No dysdiadochokinesia noted.  Psychiatric:        Mood and Affect: Mood normal.        Behavior: Behavior normal.     Data Reviewed: {Tip this will not be part of the note when signed- Document your independent interpretation of telemetry tracing, EKG, lab, Radiology test or any other diagnostic tests. Add any new diagnostic test ordered today. (Optional):[id number]} There are no new results to review at this time.    Latest Ref Rng & Units 06/11/2023    1:07 PM 03/13/2023    3:56 PM 09/01/2020    9:32 PM  CBC  WBC 4.0 - 10.5 K/uL 6.9  5.5  7.1   Hemoglobin 13.0 - 17.0 g/dL 46.9  62.9  52.8   Hematocrit 39.0 - 52.0 % 42.3  43.4  44.5   Platelets 150 - 400 K/uL 250  217  221       Latest Ref Rng & Units 06/11/2023    1:07 PM 03/13/2023    4:04 PM 03/13/2023    3:56 PM  CMP  Glucose 70 - 99 mg/dL 413   244   BUN 6 - 20 mg/dL 14   14   Creatinine 0.10 - 1.24 mg/dL 2.72   5.36   Sodium 644 - 145 mmol/L 136   137   Potassium 3.5 - 5.1 mmol/L 3.5   3.6   Chloride 98 - 111 mmol/L 102   101   CO2 22 - 32 mmol/L 24   24   Calcium 8.9 - 10.3 mg/dL 9.2   03.4   Total Protein 6.5 - 8.1 g/dL 7.2  7.6    Total  Bilirubin 0.0 - 1.2 mg/dL 0.9  0.6    Alkaline Phos 38 - 126 U/L 55  43    AST 15 - 41 U/L 30  39    ALT 0 - 44 U/L 48  60     CT Angio Chest PE W and/or Wo Contrast Result Date: 06/11/2023 CLINICAL DATA:  Pulmonary embolism (PE) suspected, low to intermediate prob, positive D-dimer EXAM: CT ANGIOGRAPHY CHEST WITH CONTRAST TECHNIQUE: Multidetector CT imaging of the chest was performed using the standard protocol during bolus administration of intravenous contrast. Multiplanar CT image reconstructions and MIPs were obtained to evaluate the vascular anatomy. RADIATION DOSE REDUCTION: This exam was performed according to the departmental dose-optimization program which includes automated exposure control, adjustment of the mA and/or kV according to patient size and/or use of iterative reconstruction technique. CONTRAST:  75mL OMNIPAQUE IOHEXOL 350 MG/ML SOLN COMPARISON:  X-ray 03/13/2023 FINDINGS: Cardiovascular: Satisfactory opacification of the pulmonary arteries to the segmental level. No evidence of pulmonary embolism. Thoracic aorta is normal in course and caliber. Normal heart size. No pericardial effusion. Mediastinum/Nodes: No enlarged mediastinal, hilar, or axillary lymph nodes. Thyroid gland, trachea, and esophagus demonstrate no significant findings. Lungs/Pleura: Shallow inspiration. Both lungs are clear. No pleural effusion or pneumothorax. Upper Abdomen: No acute abnormality. Musculoskeletal: No chest wall abnormality. No acute or significant osseous findings. Review of the MIP images confirms the above findings. IMPRESSION: No evidence of pulmonary embolism or other acute intrathoracic process. Electronically Signed   By: Duanne Guess D.O.   On: 06/11/2023 17:51   MR BRAIN WO CONTRAST Result Date: 06/11/2023 CLINICAL DATA:  Neuro deficit, acute, stroke suspected left arm/left sided weakness EXAM: MRI HEAD WITHOUT CONTRAST TECHNIQUE: Multiplanar, multiecho pulse sequences of the brain and  surrounding structures were obtained without intravenous contrast. COMPARISON:  MRI head Sep 02, 2020. FINDINGS: Brain: No acute infarction, hemorrhage, hydrocephalus, extra-axial collection or mass lesion. Similar left cerebellar encephalomalacia. Vascular: Major arterial flow voids are maintained at the skull base. Skull and upper cervical spine: Normal marrow signal. Sinuses/Orbits: Clear sinuses.  No acute orbital findings. Other: No mastoid effusions. IMPRESSION: 1. No acute intracranial abnormality. 2. Similar left cerebellar encephalomalacia. Electronically Signed   By: Feliberto Harts M.D.   On: 06/11/2023 15:26    Assessment and Plan: No notes have been filed under this hospital service. Service: Hospitalist  TIA    Advance Care Planning:   Code Status: Full Code   Consults: none  Family Communication: updated family at bedside  Severity of Illness: The appropriate patient status for this patient is OBSERVATION. Observation status is judged to be reasonable and necessary in order to provide the required intensity of service to ensure the patient's safety. The patient's presenting symptoms, physical exam findings, and initial radiographic and laboratory data in the context of their medical condition is felt to place them at decreased risk for further clinical deterioration. Furthermore, it is anticipated that the patient will be medically stable for discharge from the hospital within 2 midnights of admission.   Portions of this note were generated with Scientist, clinical (histocompatibility and immunogenetics). Dictation errors may occur despite best attempts at proofreading.   Author: Briscoe Burns, MD 06/11/2023 11:49 PM  For on call review [email or web address].

## 2023-06-12 ENCOUNTER — Observation Stay (HOSPITAL_BASED_OUTPATIENT_CLINIC_OR_DEPARTMENT_OTHER): Payer: No Typology Code available for payment source

## 2023-06-12 ENCOUNTER — Observation Stay (HOSPITAL_COMMUNITY): Payer: No Typology Code available for payment source

## 2023-06-12 DIAGNOSIS — G459 Transient cerebral ischemic attack, unspecified: Secondary | ICD-10-CM | POA: Diagnosis not present

## 2023-06-12 DIAGNOSIS — E876 Hypokalemia: Secondary | ICD-10-CM | POA: Diagnosis not present

## 2023-06-12 LAB — URINE CULTURE: Culture: NO GROWTH

## 2023-06-12 LAB — BASIC METABOLIC PANEL
Anion gap: 10 (ref 5–15)
BUN: 10 mg/dL (ref 6–20)
CO2: 23 mmol/L (ref 22–32)
Calcium: 9 mg/dL (ref 8.9–10.3)
Chloride: 105 mmol/L (ref 98–111)
Creatinine, Ser: 1.2 mg/dL (ref 0.61–1.24)
GFR, Estimated: >60 mL/min (ref >60)
Glucose, Bld: 192 mg/dL — ABNORMAL HIGH (ref 70–99)
Potassium: 3.4 mmol/L — ABNORMAL LOW (ref 3.5–5.1)
Sodium: 138 mmol/L (ref 135–145)

## 2023-06-12 LAB — LIPID PANEL
Cholesterol: 187 mg/dL (ref 0–200)
HDL: 31 mg/dL — ABNORMAL LOW (ref >40)
LDL Cholesterol: 98 mg/dL (ref 0–99)
Total CHOL/HDL Ratio: 6 {ratio}
Triglycerides: 289 mg/dL — ABNORMAL HIGH (ref <150)
VLDL: 58 mg/dL — ABNORMAL HIGH (ref 0–40)

## 2023-06-12 LAB — CBC
HCT: 40.5 % (ref 39.0–52.0)
Hemoglobin: 14.1 g/dL (ref 13.0–17.0)
MCH: 29.8 pg (ref 26.0–34.0)
MCHC: 34.8 g/dL (ref 30.0–36.0)
MCV: 85.6 fL (ref 80.0–100.0)
Platelets: 241 10*3/uL (ref 150–400)
RBC: 4.73 MIL/uL (ref 4.22–5.81)
RDW: 13.5 % (ref 11.5–15.5)
WBC: 6 10*3/uL (ref 4.0–10.5)
nRBC: 0 % (ref 0.0–0.2)

## 2023-06-12 LAB — ECHOCARDIOGRAM COMPLETE
AR max vel: 3.41 cm2
AV Peak grad: 4.8 mm[Hg]
Ao pk vel: 1.1 m/s
Area-P 1/2: 3.17 cm2
Height: 72 in
S' Lateral: 2.4 cm
Weight: 4081.16 [oz_av]

## 2023-06-12 LAB — GLUCOSE, CAPILLARY
Glucose-Capillary: 158 mg/dL — ABNORMAL HIGH (ref 70–99)
Glucose-Capillary: 197 mg/dL — ABNORMAL HIGH (ref 70–99)
Glucose-Capillary: 122 mg/dL — ABNORMAL HIGH (ref 70–99)

## 2023-06-12 LAB — TROPONIN I (HIGH SENSITIVITY)
Troponin I (High Sensitivity): 10 ng/L (ref <18)
Troponin I (High Sensitivity): 16 ng/L (ref <18)

## 2023-06-12 LAB — HIV ANTIBODY (ROUTINE TESTING W REFLEX): HIV Screen 4th Generation wRfx: NONREACTIVE

## 2023-06-12 MED ORDER — FENOFIBRATE 54 MG PO TABS: 54.0000 mg | ORAL_TABLET | Freq: Every day | ORAL | 0 refills | Status: DC

## 2023-06-12 MED ORDER — ASPIRIN 81 MG PO TBEC
81.0000 mg | DELAYED_RELEASE_TABLET | Freq: Every day | ORAL | Status: DC
  Administered 2023-06-12: 81 mg via ORAL
  Filled 2023-06-12: qty 1

## 2023-06-12 MED ORDER — ATORVASTATIN CALCIUM 40 MG PO TABS: 40.0000 mg | ORAL_TABLET | Freq: Every day | ORAL | Status: DC

## 2023-06-12 MED ORDER — ACETAMINOPHEN 325 MG PO TABS: 650.0000 mg | ORAL_TABLET | Freq: Four times a day (QID) | ORAL | 0 refills | Status: AC | PRN

## 2023-06-12 MED ORDER — ASPIRIN 81 MG PO TBEC: 81.0000 mg | DELAYED_RELEASE_TABLET | Freq: Every day | ORAL | 12 refills | Status: AC

## 2023-06-12 MED ORDER — CLOPIDOGREL BISULFATE 75 MG PO TABS: 75.0000 mg | ORAL_TABLET | Freq: Every day | ORAL | 0 refills | Status: DC

## 2023-06-12 MED ORDER — METFORMIN HCL 500 MG PO TABS: 500.0000 mg | ORAL_TABLET | Freq: Two times a day (BID) | ORAL | Status: DC

## 2023-06-12 MED ORDER — IOHEXOL 350 MG/ML SOLN
75.0000 mL | Freq: Once | INTRAVENOUS | Status: AC | PRN
  Administered 2023-06-12: 75 mL via INTRAVENOUS

## 2023-06-12 MED ORDER — FENOFIBRATE 54 MG PO TABS
54.0000 mg | ORAL_TABLET | Freq: Every day | ORAL | Status: DC
  Administered 2023-06-12: 54 mg via ORAL
  Filled 2023-06-12: qty 1

## 2023-06-12 MED ORDER — CLOPIDOGREL BISULFATE 75 MG PO TABS
75.0000 mg | ORAL_TABLET | Freq: Every day | ORAL | Status: DC
Start: 2023-06-13 — End: 2023-06-13

## 2023-06-12 MED ORDER — CLOPIDOGREL BISULFATE 75 MG PO TABS
300.0000 mg | ORAL_TABLET | Freq: Once | ORAL | Status: AC
  Administered 2023-06-12: 300 mg via ORAL
  Filled 2023-06-12: qty 4

## 2023-06-12 MED ORDER — METFORMIN HCL 500 MG PO TABS: 500.0000 mg | ORAL_TABLET | Freq: Two times a day (BID) | ORAL | 0 refills | Status: DC

## 2023-06-12 MED ORDER — ASPIRIN 325 MG PO TABS
325.0000 mg | ORAL_TABLET | Freq: Once | ORAL | Status: AC
  Administered 2023-06-12: 325 mg via ORAL
  Filled 2023-06-12: qty 1

## 2023-06-12 MED ORDER — POTASSIUM CHLORIDE CRYS ER 20 MEQ PO TBCR
40.0000 meq | EXTENDED_RELEASE_TABLET | Freq: Two times a day (BID) | ORAL | Status: DC
  Administered 2023-06-12: 40 meq via ORAL
  Filled 2023-06-12: qty 2

## 2023-06-12 MED ORDER — ATORVASTATIN CALCIUM 40 MG PO TABS: 40.0000 mg | ORAL_TABLET | Freq: Every day | ORAL | 0 refills | Status: DC

## 2023-06-12 NOTE — Progress Notes (Signed)
 Echocardiogram 2D Echocardiogram has been performed.  Steve Wolfe 06/12/2023, 2:12 PM

## 2023-06-12 NOTE — Progress Notes (Signed)
 Pt was discharging. This RN went through AVS discharge instructions with the pt. All questions answered. Pt's wife was at bedside to transport the pt home. IV and telemetry removed. Pt was alert and ambulatory. No distress noted. All pt's belonging packed and transported with the pt.

## 2023-06-12 NOTE — Plan of Care (Signed)
  Problem: Clinical Measurements: Goal: Ability to maintain clinical measurements within normal limits will improve Outcome: Progressing   Problem: Clinical Measurements: Goal: Will remain free from infection Outcome: Progressing   Problem: Clinical Measurements: Goal: Diagnostic test results will improve Outcome: Progressing   Problem: Clinical Measurements: Goal: Respiratory complications will improve Outcome: Progressing   Problem: Clinical Measurements: Goal: Cardiovascular complication will be avoided Outcome: Progressing   Problem: Activity: Goal: Risk for activity intolerance will decrease Outcome: Progressing   

## 2023-06-12 NOTE — Progress Notes (Signed)
 PT Cancellation Note  Patient Details Name: Steve Wolfe MRN: [id number] DOB: 20-Oct-1980   Cancelled Treatment:    Reason Eval/Treat Not Completed: PT screened, no needs identified, will sign off  Patient reports walking fine and no further episodes. Brief balance and gait screen completed with no deficits noted.    Jerolyn Center, PT Acute Rehabilitation Services  Office [phone]  [phone] Zena Amos 06/12/2023, 9:55 AM

## 2023-06-12 NOTE — Discharge Summary (Incomplete)
 Physician Discharge Summary   Patient: Steve Wolfe MRN: [id number] DOB: April 23, 1980  Admit date:     06/11/2023  Discharge date: {dischdate:26783}  Discharge Physician: Marguerita Merles, DO   PCP: Pc, [facility]   Recommendations at discharge:  {Tip this will not be part of the note when signed- Example include specific recommendations for outpatient follow-up, pending tests to follow-up on. (Optional):26781}  ***  Discharge Diagnoses: Principal Problem:   Transient ischemic attack (TIA)  Resolved Problems:   * No resolved hospital problems. [facility] Course:         Assessment and Plan:  TIA History of superior left cerebellar infarct Patient presenting after experiencing a 15-minute episode of transient left upper extremity weakness and lightheadedness/dizziness.  Based on his history, this does appear to be a transient ischemic attack.  He has a prior history of a cerebellar infarct that was noted to be subacute or chronic on a prior MRI brain in May 2022 with today's MRI showing left cerebellar encephalomalacia but no acute stroke.  It is possible that he may have had recrudescence of his prior stroke though he does not recall experiencing any symptoms in the past. However, will need to treat as TIA and perform workup for this. EKG with sinus tachycardia but no obvious ischemic changes. As aforementioned, MRI without acute stroke noted. Will obtain ECHO, carotid dopplers, lipid panel. Will also start him on aspirin monotherapy (ABCD2 score <4) for secondary prevention. Fortunately, his neurological exam is without any notable abnormalities. -Aspirin 325mg  once, then ASA 81mg  daily from tomorrow -f/u ECHO, carotid dopplers -f/u lipid panel, will likely need statin at discharge -f/u orthostatic vitals -A1c 7.3%, no prior history of diabetes -telemetry -carb-modified diet -PT eval -lovenox for dvt ppx -place in observation/telemetry   Type 2 NSTEMI -troponins  elevated 32 > 48 -EKG with sinus tachycardia but no obvious ischemic changes -likely demand ischemia in the setting of TIA and sinus tachycardia -trend troponins until peak    Asymptomatic bacteriuria -patient without any urinary complaints -UA with trace leukocytes, many bacteria, 11-20 WBCs -urine culture ordered by EDP -one dose of rocephin provided in ED, will not continue given lack of symptoms  Hypokalemia -Patient's K+ Level Trend: Recent Labs  Lab 06/11/23 1307 06/12/23 0057  K 3.5 3.4*  -Replete with *** -Continue to Monitor and Replete as Necessary -Repeat CMP in the AM   New Onset Diabetes Mellitus Type 2 -Hemoglobin A1c was 7.3 -CBG Trend: Recent Labs  Lab 06/11/23 1237 06/12/23 0609  GLUCAP 119* 158*  -Patient with no prior history of T2DM. Blood glucose elevated at 113 on CMP today. -hgb A1c 7.3% -trend CBGs, goal 140-180 -consider adding SSI if CBGs above goal -will need oral regimen at discharge and appropriate outpatient follow up (reports he has PCP in [place])  Elevated D-Dimer -D-Dimer was 0.92 -CTA Chest PE Protocol done and showed "No evidence of pulmonary embolism or other acute intrathoracic process."  HLD -Lipid panel was done and showed a total cholesterol/HDL ratio of 6.0, cholesterol of 187, HDL 31, LDL of 98, triglycerides of 289, VLDL 58  GERD/GI Prophylaxis  -continue protonix  Class I Obesity -Complicates overall prognosis and care -Estimated body mass index is 34.59 kg/m as calculated from the following:   Height as of this encounter: 6' (1.829 m).   Weight as of this encounter: 115.7 kg.  -Weight Loss and Dietary Counseling given   Assessment and Plan: No notes have been filed under this  hospital service. Service: Hospitalist     {Tip this will not be part of the note when signed Body mass index is 34.59 kg/m. , ,  (Optional):26781}  {(NOTE) Pain control PDMP Statment (Optional):26782} Consultants: *** Procedures  performed: ***  Disposition: {Plan; Disposition:26390} Diet recommendation:  {Diet_Plan:26776} DISCHARGE MEDICATION: Allergies as of 06/12/2023   No Known Allergies   Med Rec must be completed prior to using this [facility]***       Discharge Exam: Filed Weights   06/11/23 1228 06/12/23 0500  Weight: 112 kg 115.7 kg   ***  Condition at discharge: {DC Condition:26389}  The results of significant diagnostics from this hospitalization (including imaging, microbiology, ancillary and laboratory) are listed below for reference.   Imaging Studies: CT Angio Chest PE W and/or Wo Contrast Result Date: 06/11/2023 CLINICAL DATA:  Pulmonary embolism (PE) suspected, low to intermediate prob, positive D-dimer EXAM: CT ANGIOGRAPHY CHEST WITH CONTRAST TECHNIQUE: Multidetector CT imaging of the chest was performed using the standard protocol during bolus administration of intravenous contrast. Multiplanar CT image reconstructions and MIPs were obtained to evaluate the vascular anatomy. RADIATION DOSE REDUCTION: This exam was performed according to the departmental dose-optimization program which includes automated exposure control, adjustment of the mA and/or kV according to patient size and/or use of iterative reconstruction technique. CONTRAST:  75mL OMNIPAQUE IOHEXOL 350 MG/ML SOLN COMPARISON:  X-ray 03/13/2023 FINDINGS: Cardiovascular: Satisfactory opacification of the pulmonary arteries to the segmental level. No evidence of pulmonary embolism. Thoracic aorta is normal in course and caliber. Normal heart size. No pericardial effusion. Mediastinum/Nodes: No enlarged mediastinal, hilar, or axillary lymph nodes. Thyroid gland, trachea, and esophagus demonstrate no significant findings. Lungs/Pleura: Shallow inspiration. Both lungs are clear. No pleural effusion or pneumothorax. Upper Abdomen: No acute abnormality. Musculoskeletal: No chest wall abnormality. No acute or significant osseous findings. Review  of the MIP images confirms the above findings. IMPRESSION: No evidence of pulmonary embolism or other acute intrathoracic process. Electronically Signed   By: Duanne Guess D.O.   On: 06/11/2023 17:51   MR BRAIN WO CONTRAST Result Date: 06/11/2023 CLINICAL DATA:  Neuro deficit, acute, stroke suspected left arm/left sided weakness EXAM: MRI HEAD WITHOUT CONTRAST TECHNIQUE: Multiplanar, multiecho pulse sequences of the brain and surrounding structures were obtained without intravenous contrast. COMPARISON:  MRI head Sep 02, 2020. FINDINGS: Brain: No acute infarction, hemorrhage, hydrocephalus, extra-axial collection or mass lesion. Similar left cerebellar encephalomalacia. Vascular: Major arterial flow voids are maintained at the skull base. Skull and upper cervical spine: Normal marrow signal. Sinuses/Orbits: Clear sinuses.  No acute orbital findings. Other: No mastoid effusions. IMPRESSION: 1. No acute intracranial abnormality. 2. Similar left cerebellar encephalomalacia. Electronically Signed   By: Feliberto Harts M.D.   On: 06/11/2023 15:26    Microbiology: Results for orders placed or performed during the hospital encounter of 09/01/20  Resp Panel by RT-PCR (Flu A&B, Covid) Nasopharyngeal Swab     Status: None   Collection Time: 09/01/20  9:55 PM   Specimen: Nasopharyngeal Swab; Nasopharyngeal(NP) swabs in vial transport medium  Result Value Ref Range Status   SARS Coronavirus 2 by RT PCR NEGATIVE NEGATIVE Final    Comment: (NOTE) SARS-CoV-2 target nucleic acids are NOT DETECTED.  The SARS-CoV-2 RNA is generally detectable in upper respiratory specimens during the acute phase of infection. The lowest concentration of SARS-CoV-2 viral copies this assay can detect is 138 copies/mL. A negative result does not preclude SARS-Cov-2 infection and should not be used as the sole basis for treatment or  other patient management decisions. A negative result may occur with  improper specimen  collection/handling, submission of specimen other than nasopharyngeal swab, presence of viral mutation(s) within the areas targeted by this assay, and inadequate number of viral copies(<138 copies/mL). A negative result must be combined with clinical observations, patient history, and epidemiological information. The expected result is Negative.  Fact Sheet for Patients:  BloggerCourse.com  Fact Sheet for Healthcare Providers:  SeriousBroker.it  This test is no t yet approved or cleared by the [place] FDA and  has been authorized for detection and/or diagnosis of SARS-CoV-2 by FDA under an Emergency Use Authorization (EUA). This EUA will remain  in effect (meaning this test can be used) for the duration of the COVID-19 declaration under Section 564(b)(1) of the Act, 21 U.S.C.section 360bbb-3(b)(1), unless the authorization is terminated  or revoked sooner.       Influenza A by PCR NEGATIVE NEGATIVE Final   Influenza B by PCR NEGATIVE NEGATIVE Final    Comment: (NOTE) The Xpert Xpress SARS-CoV-2/FLU/RSV plus assay is intended as an aid in the diagnosis of influenza from Nasopharyngeal swab specimens and should not be used as a sole basis for treatment. Nasal washings and aspirates are unacceptable for Xpert Xpress SARS-CoV-2/FLU/RSV testing.  Fact Sheet for Patients: BloggerCourse.com  Fact Sheet for Healthcare Providers: SeriousBroker.it  This test is not yet approved or cleared by the [place] FDA and has been authorized for detection and/or diagnosis of SARS-CoV-2 by FDA under an Emergency Use Authorization (EUA). This EUA will remain in effect (meaning this test can be used) for the duration of the COVID-19 declaration under Section 564(b)(1) of the Act, 21 U.S.C. section 360bbb-3(b)(1), unless the authorization is terminated or revoked.  Performed at [facility], [address] [place], [place] [address]     Labs: CBC: Recent Labs  Lab 06/11/23 1307 06/12/23 0211  WBC 6.9 6.0  HGB 14.5 14.1  HCT 42.3 40.5  MCV 84.6 85.6  PLT 250 241   Basic Metabolic Panel: Recent Labs  Lab 06/11/23 1307 06/12/23 0057  NA 136 138  K 3.5 3.4*  CL 102 105  CO2 24 23  GLUCOSE 113* 192*  BUN 14 10  CREATININE 1.07 1.20  CALCIUM 9.2 9.0   Liver Function Tests: Recent Labs  Lab 06/11/23 1307  AST 30  ALT 48*  ALKPHOS 55  BILITOT 0.9  PROT 7.2  ALBUMIN 4.3   CBG: Recent Labs  Lab 06/11/23 1237 06/12/23 0609  GLUCAP 119* 158*    Discharge time spent: {LESS THAN/GREATER THAN:26388} 30 minutes.  Signed: Merlene Laughter, DO [facility] 06/12/2023

## 2023-06-12 NOTE — Consult Note (Signed)
 NEUROLOGY CONSULT NOTE   Date of service: June 12, 2023 Patient Name: Steve Wolfe MRN:  [id number] DOB:  06-Feb-1981 Chief Complaint: "Transient left arm and leg weakness" Requesting Provider: Merlene Laughter, DO  History of Present Illness  Steve Wolfe is a 43 y.o. male with hx of left cerebellar stroke and recent diagnosis of diabetes who presents with a transient episode of left arm and leg weakness, lightheadedness, dizziness and difficulty walking.  Patient states that he was in his usual state of health and was sitting in his car and felt weak and dizzy as if he was about to pass out.  He noted weakness of his left arm at that time.  He got out of his car and went to walk to his house but had to sit down on the ground and nearly lost consciousness.  He states that he had difficulty ambulating and that his left leg was definitely weaker than his right.  He denies any other recent symptoms.  LKW: Approximately 11:00 2/23 Modified rankin score: 0-Completely asymptomatic and back to baseline post- stroke IV Thrombolysis: No, symptoms resolved  EVT: No, symptoms resolved and no LVO   NIHSS components Score: Comment  1a Level of Conscious 0[x]  1[]  2[]  3[]      1b LOC Questions 0[x]  1[]  2[]       1c LOC Commands 0[x]  1[]  2[]       2 Best Gaze 0[x]  1[]  2[]       3 Visual 0[x]  1[]  2[]  3[]      4 Facial Palsy 0[x]  1[]  2[]  3[]      5a Motor Arm - left 0[x]  1[]  2[]  3[]  4[]  UN[]    5b Motor Arm - Right 0[x]  1[]  2[]  3[]  4[]  UN[]    6a Motor Leg - Left 0[x]  1[]  2[]  3[]  4[]  UN[]    6b Motor Leg - Right 0[x]  1[]  2[]  3[]  4[]  UN[]    7 Limb Ataxia 0[x]  1[]  2[]  3[]  UN[]     8 Sensory 0[x]  1[]  2[]  UN[]      9 Best Language 0[x]  1[]  2[]  3[]      10 Dysarthria 0[x]  1[]  2[]  UN[]      11 Extinct. and Inattention 0[x]  1[]  2[]       TOTAL:0       ROS  Comprehensive ROS performed and pertinent positives documented in HPI   Past History   Past Medical History:  Diagnosis Date   Stroke [facility])     History  reviewed. No pertinent surgical history.  Family History: History reviewed. No pertinent family history.  Social History  reports that he has never smoked. He has never used smokeless tobacco. He reports current alcohol use. He reports that he does not currently use drugs.  No Known Allergies  Medications   Current Facility-Administered Medications:    acetaminophen (TYLENOL) tablet 650 mg, 650 mg, Oral, Q6H PRN, 650 mg at 06/11/23 2347 **OR** acetaminophen (TYLENOL) suppository 650 mg, 650 mg, Rectal, Q6H PRN, Briscoe Burns, MD   aspirin EC tablet 81 mg, 81 mg, Oral, Daily, Briscoe Burns, MD, 81 mg at 06/12/23 1151   atorvastatin (LIPITOR) tablet 40 mg, 40 mg, Oral, QHS, Jamieon Lannen L, MD   enoxaparin (LOVENOX) injection 40 mg, 40 mg, Subcutaneous, Q24H, Jinwala, Sagar H, MD, 40 mg at 06/12/23 1151   pantoprazole (PROTONIX) EC tablet 40 mg, 40 mg, Oral, Daily, Briscoe Burns, MD, 40 mg at 06/12/23 1151   potassium chloride SA (KLOR-CON M) CR tablet 40 mEq, 40 mEq, Oral, BID, Sheikh, Omair Broadway,  DO   sodium chloride flush (NS) 0.9 % injection 3 mL, 3 mL, Intravenous, Q12H, Briscoe Burns, MD, 3 mL at 06/12/23 1151  Vitals   Vitals:   06/12/23 0500 06/12/23 0759 06/12/23 1149 06/12/23 1520  BP:  (!) 122/90 122/81 (!) 120/90  Pulse:  74 84 76  Resp:  18 20 18   Temp:  98.4 F (36.9 C) 98.7 F (37.1 C) 98.4 F (36.9 C)  TempSrc:  Oral Oral Oral  SpO2:  96% 97% 97%  Weight: 115.7 kg     Height:        Body mass index is 34.59 kg/m.  Physical Exam   Constitutional: Appears well-developed and well-nourished.  Psych: Affect appropriate to situation.  Eyes: No scleral injection.  HENT: No OP obstruction.  Head: Normocephalic.  Respiratory: Effort normal, non-labored breathing.  Skin: WDI.   Neurologic Examination    NEURO:  Mental Status: Alert and oriented to person place time and situation, able to give clear and coherent history of present  illness Speech/Language: speech is without dysarthria or aphasia.  Naming, repetition, fluency, and comprehension intact.  Cranial Nerves:  II: PERRL. Visual fields full.  III, IV, VI: EOMI. Eyelids elevate symmetrically.  V: Sensation is intact to light touch and symmetrical to face.  VII: Smile is symmetrical.  VIII: hearing intact to voice. IX, X:  Phonation is normal.  BM:[id number] shrug 5/5. XII: tongue is midline without fasciculations. Motor: 5/5 strength to all muscle groups tested.  Tone: is normal and bulk is normal Sensation- Intact to light touch bilaterally. Extinction absent to light touch to DSS.  Coordination: FTN intact bilaterally, HKS: no ataxia in BLE Gait- deferred   Labs/Imaging/Neurodiagnostic studies   CBC:  Recent Labs  Lab 2023-06-12 1307 06/12/23 0211  WBC 6.9 6.0  HGB 14.5 14.1  HCT 42.3 40.5  MCV 84.6 85.6  PLT 250 241   Basic Metabolic Panel:  Lab Results  Component Value Date   NA 138 06/12/2023   K 3.4 (L) 06/12/2023   CO2 23 06/12/2023   GLUCOSE 192 (H) 06/12/2023   BUN 10 06/12/2023   CREATININE 1.20 06/12/2023   CALCIUM 9.0 06/12/2023   GFRNONAA >60 06/12/2023   GFRAA >60 03/13/2018   Lipid Panel:  Lab Results  Component Value Date   CHOL 187 06/12/2023   HDL 31 (L) 06/12/2023   LDLCALC 98 06/12/2023   TRIG 289 (H) 06/12/2023   CHOLHDL 6.0 06/12/2023   HgbA1c:  Lab Results  Component Value Date   HGBA1C 7.3 (H) 2023-06-12   Urine Drug Screen:     Component Value Date/Time   LABOPIA NONE DETECTED 2023-06-12 1245   COCAINSCRNUR NONE DETECTED 12-Jun-2023 1245   LABBENZ NONE DETECTED 06-12-23 1245   AMPHETMU NONE DETECTED 06/12/2023 1245   THCU NONE DETECTED 12-Jun-2023 1245   LABBARB NONE DETECTED 2023-06-12 1245    Alcohol Level No results found for: "ETH" INR No results found for: "INR" APTT No results found for: "APTT" AED levels: No results found for: "PHENYTOIN", "ZONISAMIDE", "LAMOTRIGINE",  "LEVETIRACETA"   CT angio Head and Neck with contrast(Personally reviewed): No LVO or proximal significant stenosis on MD read, radiology report pending  ECHO  1. Left ventricular ejection fraction, by estimation, is 60 to 65%. The  left ventricle has normal function. The left ventricle has no regional  wall motion abnormalities. Left ventricular diastolic parameters are  consistent with Grade I diastolic  dysfunction (impaired relaxation).   2. Right ventricular systolic function  is normal. The right ventricular  size is normal.   3. The mitral valve is normal in structure. Trivial mitral valve  regurgitation. No evidence of mitral stenosis.   4. The aortic valve is normal in structure. Aortic valve regurgitation is  not visualized. No aortic stenosis is present.   5. Aortic dilatation noted. 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 There is borderline dilatation of the ascending aorta,  measuring 39 mm.   6. The inferior vena cava is normal in size with greater than 50%  respiratory variability, suggesting right atrial pressure of 3 mmHg.  [Normal biatrial sizes]  MRI Brain(Personally reviewed): 1. No acute intracranial abnormality. 2. Similar left cerebellar encephalomalacia.  ASSESSMENT   Eliot Popper is a 43 y.o. male with history of left cerebellar stroke and recent diagnosis of diabetes who presents with a transient episode of left-sided weakness, dizziness, lightheadedness and near syncope which occurred around 11:00 yesterday morning.  Patient states he has never had this happen before and the symptoms have completely resolved.  MRI was negative for acute infarct, and CT angiogram demonstrated no LVO or hemodynamically significant stenosis per attending read.  On exam, he is back to baseline with no deficits.  TIA is the most likely etiology of his symptoms.  Echocardiogram did reveal some aortic dilatation, and patient was instructed to follow-up with this  with his primary care provider.  He also has agreed to see his primary care provider for management of his blood pressure, hyperlipidemia and diabetes.  RECOMMENDATIONS   # TIA - LDL goal < 70, added atorvastatin 40 mg nightly, add fenofibrate 54 mg daily for elevated triglycerides - A1c goal < 7%, appreciate primary team interventions - DAPT for 21 days with 300 mg Plavix load and 75 mg daily thereafter, aspirin monotherapy lifelong - MRI brain negative as above - CTA completed on neuro recs as above, negative confirmed by radiology (formal report pending) - Echocardiogram revealed aortic dilation and grade I diastolic dysfunction; follow-up per primary / PCP - Risk factor modification - Telemetry monitoring while inpatient - Blood pressure goal   - Normotension - PT consult, OT consult, Speech consult, unless patient is back to baseline - Neurology will sign off at this time, please reach out with any questions / concerns   ______________________________________________________________________ Patient seen by NP and then by MD, MD to edit note as needed. Cortney E Ernestina Columbia , MSN, AGACNP-BC [facility] See Amion for schedule and pager information 06/12/2023 6:23 PM   Attending Neurologist's note:  I personally saw this patient, gathering history, performing a full neurologic examination, reviewing relevant labs, personally reviewing relevant imaging including MRI brain, CTA head and neck, and formulated the assessment and plan, adding the note above for completeness and clarity to accurately reflect my thoughts  Brooke Dare MD-PhD [facility] [phone] Available 7 AM to 7 PM, outside these hours please contact Neurologist on call listed on AMION

## 2023-06-12 NOTE — Progress Notes (Signed)
 Transition of Care [facility]) - Inpatient Brief Assessment   Patient Details  Name: Steve Wolfe MRN: [id number] Date of Birth: April 07, 1981  Transition of Care [facility]) CM/SW Contact:    Ronny Bacon, RN Phone Number: 06/12/2023, 1:48 PM   Clinical Narrative: Patient from home with c/o weakness and syncope. No TOC needs at this time.   Transition of Care Asessment: Insurance and Status: (P) Insurance coverage has been reviewed Patient has primary care physician: (P) Yes Home environment has been reviewed: (P) House Prior level of function:: (P) Independent Prior/Current Home Services: (P) No current home services Social Drivers of Health Review: (P) SDOH reviewed no interventions necessary Readmission risk has been reviewed: (P) Yes Transition of care needs: (P) no transition of care needs at this time

## 2023-06-12 NOTE — Discharge Summary (Signed)
 Physician Discharge Summary   Patient: Steve Wolfe MRN: [id number] DOB: 04-21-1980  Admit date:     06/11/2023  Discharge date: 06/12/23  Discharge Physician: Marguerita Merles, DO   PCP: Don Broach, [facility]   Recommendations at discharge:   Follow-up with PCP within 1 to 2 weeks repeat CBC, CMP, mag, Phos within 1 week discussed echo findings about the aortic root dilatation and grade 1 diastolic dysfunction with PCP Follow-up with neurology in outpatient setting  Discharge Diagnoses: Principal Problem:   Transient ischemic attack (TIA)  Resolved Problems:   * No resolved hospital problems. [facility] Course: The patient is a 43 year old Hispanic male with a past medical history significant for a superior left cerebellar infarct who presented to the ED after experiencing left arm weakness and lightheadedness and dizziness.  Symptoms resolved within 15 minutes and he is back to his baseline.  Came to the ED for further workup and neurology felt he had a TIA.  Underwent workup and is now on dual antiplatelet therapy for 3 weeks and then just aspirin monotherapy.  And will need to follow-up with PCP for further evaluation and for his echo findings as well.  Will also need to follow-up with neurology in outpatient setting.  Assessment and Plan:  TIA History of superior left cerebellar infarct Patient presenting after experiencing a 15-minute episode of transient left upper extremity weakness and lightheadedness/dizziness.  Based on his history, this does appear to be a transient ischemic attack.  He has a prior history of a cerebellar infarct that was noted to be subacute or chronic on a prior MRI brain in May 2022 with today's MRI showing left cerebellar encephalomalacia but no acute stroke.  It is possible that he may have had recrudescence of his prior stroke though he does not recall experiencing any symptoms in the past. However, will need to treat as TIA and perform workup for this.  EKG with sinus tachycardia but no obvious ischemic changes. As aforementioned, MRI without acute stroke noted. Will obtain ECHO, carotid dopplers, lipid panel. Will also start him on aspirin monotherapy (ABCD2 score <4) for secondary prevention. Fortunately, his neurological exam is without any notable abnormalities. -Underwent complete workup and was placed on dual antiplatelet therapy and then neurology recommended completing 21 days and then just aspirin monotherapy. -Echocardiogram revealed mild aortic dilatation grade 1 diastolic an EF of 60 to 65% dysfunction which could be follow-up in the primary setting -Therapies recommended no recommendations -CTA showed no LVO but did show "Mild tortuosity of the cervical ICA bilaterally. This isnonspecific, but can be seen in the setting of chronic hypertension. Otherwise normal CTA of the neck." -Is medically stable for discharge at this time and will need to follow-up with PCP and neurology in outpatient setting   Elevated troponin likely demand and type II NSTEMI -troponins elevated 32 > 48 and repeat was 10 -EKG with sinus tachycardia but no obvious ischemic changes -likely demand ischemia in the setting of TIA and sinus tachycardia -Echo normal    Asymptomatic Bacteriuria -patient without any urinary complaints -UA with trace leukocytes, many bacteria, 11-20 WBCs -urine culture ordered by EDP -one dose of rocephin provided in ED, will not continue given lack of symptoms however he is already placed on antibiotics in outpatient setting which she is continuing  Hypokalemia -Patient's K+ Level Trend: Recent Labs  Lab 06/11/23 1307 06/12/23 0057  K 3.5 3.4*  -Replete prior to D/C  -Continue to Monitor and Replete as Necessary -  Repeat CMP in the AM   New Onset Diabetes Mellitus Type 2 -Hemoglobin A1c was 7.3 -CBG Trend: Recent Labs  Lab 06/11/23 1237 06/12/23 0609  GLUCAP 119* 158*  -Patient with no prior history of T2DM. Blood  glucose elevated at 113 on CMP today. -hgb A1c 7.3% -trend CBGs, goal 140-180 -consider adding SSI if CBGs above goal -will need oral regimen at discharge and appropriate outpatient follow up (reports he has PCP in [place])  Elevated D-Dimer -D-Dimer was 0.92 so CTA Chest PE Protocol was done and showed "No evidence of pulmonary embolism or other acute intrathoracic process."  HLD -Lipid panel was done and showed a total cholesterol/HDL ratio of 6.0, cholesterol of 187, HDL 31, LDL of 98, triglycerides of 289, VLDL 58  GERD/GI Prophylaxis  -continue protonix  Class I Obesity -Complicates overall prognosis and care -Estimated body mass index is 34.59 kg/m as calculated from the following:   Height as of this encounter: 6' (1.829 m).   Weight as of this encounter: 115.7 kg.  -Weight Loss and Dietary Counseling given  Consultants: Neurology Procedures performed: As delineated above Disposition: Home Diet recommendation:  Discharge Diet Orders (From admission, onward)     Start     Ordered   06/12/23 0000  Diet - low sodium heart healthy        06/12/23 1937   06/12/23 0000  Diet Carb Modified        06/12/23 1937           Cardiac and Carb modified diet DISCHARGE MEDICATION: Allergies as of 06/12/2023   No Known Allergies      Medication List     STOP taking these medications    cephALEXin 500 MG capsule Commonly known as: Keflex       TAKE these medications    acetaminophen 325 MG tablet Commonly known as: TYLENOL Take 2 tablets (650 mg total) by mouth every 6 (six) hours as needed for mild pain (pain score 1-3) (or Fever >/= 101).   aspirin EC 81 MG tablet Take 1 tablet (81 mg total) by mouth daily. Swallow whole.   atorvastatin 40 MG tablet Commonly known as: LIPITOR Take 1 tablet (40 mg total) by mouth at bedtime.   cefUROXime 500 MG tablet Commonly known as: CEFTIN Take 1 tablet (500 mg total) by mouth 2 (two) times daily with a meal for 10  days.   cholecalciferol 25 MCG (1000 UNIT) tablet Commonly known as: VITAMIN D3 Take 1,000 Units by mouth daily.   clopidogrel 75 MG tablet Commonly known as: PLAVIX Take 1 tablet (75 mg total) by mouth daily.   fenofibrate 54 MG tablet Take 1 tablet (54 mg total) by mouth daily.   metFORMIN 500 MG tablet Commonly known as: GLUCOPHAGE Take 1 tablet (500 mg total) by mouth 2 (two) times daily with a meal.   multivitamin with minerals Tabs tablet Take 1 tablet by mouth daily.        Follow-up Information     [facility]. Schedule an appointment as soon as possible for a visit in 8 week(s).   Specialty: Neurology Why: Please see Dr. Pearlean Brownie or associate in clinic in 8 weeks. OK to see NP. Contact information: [address] [place] [address] [phone]               Discharge Exam: Ceasar Mons Weights   06/11/23 1228 06/12/23 0500  Weight: 112 kg 115.7 kg   Vitals:  06/12/23 1149 06/12/23 1520  BP: 122/81 (!) 120/90  Pulse: 84 76  Resp: 20 18  Temp: 98.7 F (37.1 C) 98.4 F (36.9 C)  SpO2: 97% 97%   Examination: Physical Exam:  Constitutional: WN/WD obese Hispanic male in no acute distress Respiratory: Diminished to auscultation bilaterally, no wheezing, rales, rhonchi or crackles. Normal respiratory effort and patient is not tachypenic. No accessory muscle use.  Unlabored breathing Cardiovascular: RRR, no murmurs / rubs / gallops. S1 and S2 auscultated. No extremity edema.  Abdomen: Soft, non-tender, distended secondary to body habitus. Bowel sounds positive.  GU: Deferred. Musculoskeletal: No clubbing / cyanosis of digits/nails. No joint deformity upper and lower extremities Skin: No rashes, lesions, ulcers on limited skin evaluation. No induration; Warm and dry.  Neurologic: CN 2-12 grossly intact with no focal deficits.  Romberg sign cerebellar reflexes not assessed.  Psychiatric: Normal judgment  and insight. Alert and oriented x 3. Normal mood and appropriate affect.   Condition at discharge: stable  The results of significant diagnostics from this hospitalization (including imaging, microbiology, ancillary and laboratory) are listed below for reference.   Imaging Studies: CT ANGIO HEAD NECK W WO CM Result Date: 06/12/2023 CLINICAL DATA:  Stroke/TIA. Episode of transient left upper and lower extremity weakness. EXAM: CT ANGIOGRAPHY HEAD AND NECK WITH AND WITHOUT CONTRAST TECHNIQUE: Multidetector CT imaging of the head and neck was performed using the standard protocol during bolus administration of intravenous contrast. Multiplanar CT image reconstructions and MIPs were obtained to evaluate the vascular anatomy. Carotid stenosis measurements (when applicable) are obtained utilizing NASCET criteria, using the distal internal carotid diameter as the denominator. RADIATION DOSE REDUCTION: This exam was performed according to the departmental dose-optimization program which includes automated exposure control, adjustment of the mA and/or kV according to patient size and/or use of iterative reconstruction technique. CONTRAST:  75mL OMNIPAQUE IOHEXOL 350 MG/ML SOLN COMPARISON:  MR head without contrast 06/11/2023. MRA of the head and neck 10/23/2020. FINDINGS: CT HEAD FINDINGS Brain: Remote left cerebellar infarct is again noted. No acute infarct, hemorrhage or mass lesion is present. Deep brain nuclei are within normal limits. No significant white matter lesions are present. The ventricles are of normal size. No significant extraaxial fluid collection is present. Brainstem and cerebellum are otherwise within normal limits. Midline structures are within normal limits. Vascular: No hyperdense vessel or unexpected calcification. Skull: Calvarium is intact. No focal lytic or blastic lesions are present. No significant extracranial soft tissue lesion is present. Sinuses/Orbits: The paranasal sinuses and  mastoid air cells are clear. The globes and orbits are within normal limits. Review of the MIP images confirms the above findings CTA NECK FINDINGS Aortic arch: A 4 vessel arch configuration is present. No significant atherosclerotic changes are present. No focal stenosis or aneurysm is present. No dissection is present. Right carotid system: The right common carotid artery is within normal limits. Bifurcation is unremarkable. Mild tortuosity is present distal cervical right ICA. No focal stenosis is present. Left carotid system: The left common carotid artery is within normal limits. The bifurcation is unremarkable. Mild tortuosity is present in the mid cervical left ICA without focal stenosis. Vertebral arteries: Left vertebral artery is dominant. It originates directly from the aortic arch. The right vertebral artery originates from the subclavian without focal stenosis. No significant stenosis is present in either vertebral artery in the neck. Skeleton: The vertebral body heights and alignment are normal. No focal osseous lesions are present. Other neck: Soft tissues the neck are otherwise unremarkable.  Salivary glands are within normal limits. Thyroid is normal. No significant adenopathy is present. No focal mucosal or submucosal lesions are present. Upper chest: The lung apices are clear. The thoracic inlet is within normal limits. Review of the MIP images confirms the above findings CTA HEAD FINDINGS Anterior circulation: The internal carotid arteries are within normal limits the skull base to the ICA termini. The A1 and M1 segments are normal. The anterior communicating artery is patent. MCA bifurcations are within normal limits bilaterally. The A1 and M1 segments are normal. No aneurysms are present. Posterior circulation: PICA origins are visualized and normal. Vertebrobasilar junction and basilar artery normal. The superior cerebellar arteries are patent. Both posterior cerebral arteries originate from  basilar tip. The PCA branch vessels are normal bilaterally. Venous sinuses: The dural sinuses are patent. The straight sinus and deep cerebral veins are intact. Cortical veins are within normal limits. No significant vascular malformation is evident. Anatomic variants: None Review of the MIP images confirms the above findings IMPRESSION: 1. Remote left cerebellar infarct. 2. No acute intracranial abnormality or significant interval change. 3. Mild tortuosity of the cervical ICA bilaterally. This is nonspecific, but can be seen in the setting of chronic hypertension. 4. Otherwise normal CTA of the neck. 5. Normal CTA Circle of Willis without significant proximal stenosis, aneurysm, or branch vessel occlusion. Electronically Signed   By: Marin Roberts M.D.   On: 06/12/2023 19:01   ECHOCARDIOGRAM COMPLETE Result Date: 06/12/2023    ECHOCARDIOGRAM REPORT   Patient Name:   OLEN EAVES  Date of Exam: 06/12/2023 Medical Rec #:  [id number]  Height:       72.0 in Accession #:    [id number] Weight:       255.1 lb Date of Birth:  May 01, 1980   BSA:          2.363 m Patient Age:    43 years   BP:           128/84 mmHg Patient Gender: M          HR:           81 bpm. Exam Location:  Inpatient Procedure: 2D Echo, Cardiac Doppler and Color Doppler (Both Spectral and Color            Flow Doppler were utilized during procedure). Indications:    TIA G45.9  History:        Patient has no prior history of Echocardiogram examinations.                 TIA.  Sonographer:    Lucendia Herrlich RCS Referring Phys: [id number] SAGAR H JINWALA IMPRESSIONS  1. Left ventricular ejection fraction, by estimation, is 60 to 65%. The left ventricle has normal function. The left ventricle has no regional wall motion abnormalities. Left ventricular diastolic parameters are consistent with Grade I diastolic dysfunction (impaired relaxation).  2. Right ventricular systolic function is normal. The right ventricular size is normal.  3. The mitral valve is  normal in structure. Trivial mitral valve regurgitation. No evidence of mitral stenosis.  4. The aortic valve is normal in structure. Aortic valve regurgitation is not visualized. No aortic stenosis is present.  5. Aortic dilatation noted. There is mild dilatation of the aortic root, measuring 43 mm. There is borderline dilatation of the ascending aorta, measuring 39 mm.  6. The inferior vena cava is normal in size with greater than 50% respiratory variability, suggesting right atrial pressure of 3 mmHg. FINDINGS  Left Ventricle:  Left ventricular ejection fraction, by estimation, is 60 to 65%. The left ventricle has normal function. The left ventricle has no regional wall motion abnormalities. Strain imaging was not performed. The left ventricular internal cavity  size was normal in size. There is no left ventricular hypertrophy. Left ventricular diastolic parameters are consistent with Grade I diastolic dysfunction (impaired relaxation). Right Ventricle: The right ventricular size is normal. No increase in right ventricular wall thickness. Right ventricular systolic function is normal. Left Atrium: Left atrial size was normal in size. Right Atrium: Right atrial size was normal in size. Pericardium: There is no evidence of pericardial effusion. Mitral Valve: The mitral valve is normal in structure. Trivial mitral valve regurgitation. No evidence of mitral valve stenosis. Tricuspid Valve: The tricuspid valve is normal in structure. Tricuspid valve regurgitation is trivial. No evidence of tricuspid stenosis. Aortic Valve: The aortic valve is normal in structure. Aortic valve regurgitation is not visualized. No aortic stenosis is present. Aortic valve peak gradient measures 4.8 mmHg. Pulmonic Valve: The pulmonic valve was normal in structure. Pulmonic valve regurgitation is trivial. No evidence of pulmonic stenosis. Aorta: Aortic dilatation noted. There is mild dilatation of the aortic root, measuring 43 mm. There is  borderline dilatation of the ascending aorta, measuring 39 mm. Venous: The inferior vena cava is normal in size with greater than 50% respiratory variability, suggesting right atrial pressure of 3 mmHg. IAS/Shunts: No atrial level shunt detected by color flow Doppler. Additional Comments: 3D imaging was not performed.  LEFT VENTRICLE PLAX 2D LVIDd:         4.00 cm   Diastology LVIDs:         2.40 cm   LV e' medial:    7.18 cm/s LV PW:         1.30 cm   LV E/e' medial:  8.4 LV IVS:        1.00 cm   LV e' lateral:   9.25 cm/s LVOT diam:     2.30 cm   LV E/e' lateral: 6.5 LV SV:         64 LV SV Index:   27 LVOT Area:     4.15 cm  RIGHT VENTRICLE             IVC RV S prime:     12.90 cm/s  IVC diam: 2.00 cm TAPSE (M-mode): 2.3 cm LEFT ATRIUM             Index        RIGHT ATRIUM           Index LA diam:        3.90 cm 1.65 cm/m   RA Area:     18.40 cm LA Vol (A2C):   37.2 ml 15.75 ml/m  RA Volume:   45.50 ml  19.26 ml/m LA Vol (A4C):   39.5 ml 16.72 ml/m LA Biplane Vol: 42.6 ml 18.03 ml/m  AORTIC VALVE AV Area (Vmax): 3.41 cm AV Vmax:        110.00 cm/s AV Peak Grad:   4.8 mmHg LVOT Vmax:      90.30 cm/s LVOT Vmean:     58.100 cm/s LVOT VTI:       0.154 m  AORTA Ao Root diam: 4.30 cm Ao Asc diam:  3.90 cm MITRAL VALVE MV Area (PHT): 3.17 cm    SHUNTS MV Decel Time: 239 msec    Systemic VTI:  0.15 m MV E velocity: 60.10 cm/s  Systemic Diam:  2.30 cm MV A velocity: 65.00 cm/s MV E/A ratio:  0.92 Arvilla Meres MD Electronically signed by Arvilla Meres MD Signature Date/Time: 06/12/2023/2:15:37 PM    Final    CT Angio Chest PE W and/or Wo Contrast Result Date: 06/11/2023 CLINICAL DATA:  Pulmonary embolism (PE) suspected, low to intermediate prob, positive D-dimer EXAM: CT ANGIOGRAPHY CHEST WITH CONTRAST TECHNIQUE: Multidetector CT imaging of the chest was performed using the standard protocol during bolus administration of intravenous contrast. Multiplanar CT image reconstructions and MIPs were obtained to  evaluate the vascular anatomy. RADIATION DOSE REDUCTION: This exam was performed according to the departmental dose-optimization program which includes automated exposure control, adjustment of the mA and/or kV according to patient size and/or use of iterative reconstruction technique. CONTRAST:  75mL OMNIPAQUE IOHEXOL 350 MG/ML SOLN COMPARISON:  X-ray 03/13/2023 FINDINGS: Cardiovascular: Satisfactory opacification of the pulmonary arteries to the segmental level. No evidence of pulmonary embolism. Thoracic aorta is normal in course and caliber. Normal heart size. No pericardial effusion. Mediastinum/Nodes: No enlarged mediastinal, hilar, or axillary lymph nodes. Thyroid gland, trachea, and esophagus demonstrate no significant findings. Lungs/Pleura: Shallow inspiration. Both lungs are clear. No pleural effusion or pneumothorax. Upper Abdomen: No acute abnormality. Musculoskeletal: No chest wall abnormality. No acute or significant osseous findings. Review of the MIP images confirms the above findings. IMPRESSION: No evidence of pulmonary embolism or other acute intrathoracic process. Electronically Signed   By: Duanne Guess D.O.   On: 06/11/2023 17:51   MR BRAIN WO CONTRAST Result Date: 06/11/2023 CLINICAL DATA:  Neuro deficit, acute, stroke suspected left arm/left sided weakness EXAM: MRI HEAD WITHOUT CONTRAST TECHNIQUE: Multiplanar, multiecho pulse sequences of the brain and surrounding structures were obtained without intravenous contrast. COMPARISON:  MRI head Sep 02, 2020. FINDINGS: Brain: No acute infarction, hemorrhage, hydrocephalus, extra-axial collection or mass lesion. Similar left cerebellar encephalomalacia. Vascular: Major arterial flow voids are maintained at the skull base. Skull and upper cervical spine: Normal marrow signal. Sinuses/Orbits: Clear sinuses.  No acute orbital findings. Other: No mastoid effusions. IMPRESSION: 1. No acute intracranial abnormality. 2. Similar left cerebellar  encephalomalacia. Electronically Signed   By: Feliberto Harts M.D.   On: 06/11/2023 15:26   Microbiology: Results for orders placed or performed during the hospital encounter of 06/11/23  Urine Culture     Status: None   Collection Time: 06/11/23 12:45 PM   Specimen: Urine, Clean Catch  Result Value Ref Range Status   Specimen Description   Final    URINE, CLEAN CATCH Performed at [facility], [address] [place], [place] [address]    Special Requests   Final    NONE Performed at [facility], [address] [place], [place] [address]    Culture   Final    NO GROWTH Performed at [facility], [address], [place], [place] [address]    Report Status 06/12/2023 FINAL  Final   Labs: CBC: Recent Labs  Lab 06/11/23 1307 06/12/23 0211  WBC 6.9 6.0  HGB 14.5 14.1  HCT 42.3 40.5  MCV 84.6 85.6  PLT 250 241   Basic Metabolic Panel: Recent Labs  Lab 06/11/23 1307 06/12/23 0057  NA 136 138  K 3.5 3.4*  CL 102 105  CO2 24 23  GLUCOSE 113* 192*  BUN 14 10  CREATININE 1.07 1.20  CALCIUM 9.2 9.0   Liver Function Tests: Recent Labs  Lab 06/11/23 1307  AST 30  ALT 48*  ALKPHOS  55  BILITOT 0.9  PROT 7.2  ALBUMIN 4.3   CBG: Recent Labs  Lab 06/11/23 1237 06/12/23 0609 06/12/23 1148 06/12/23 1631  GLUCAP 119* 158* 197* 122*   Discharge time spent: greater than 30 minutes.  Signed: Marguerita Merles, DO [facility] 06/13/2023

## 2023-06-12 NOTE — Hospital Course (Addendum)
         Assessment and Plan:  TIA History of superior left cerebellar infarct Patient presenting after experiencing a 15-minute episode of transient left upper extremity weakness and lightheadedness/dizziness.  Based on his history, this does appear to be a transient ischemic attack.  He has a prior history of a cerebellar infarct that was noted to be subacute or chronic on a prior MRI brain in May 2022 with today's MRI showing left cerebellar encephalomalacia but no acute stroke.  It is possible that he may have had recrudescence of his prior stroke though he does not recall experiencing any symptoms in the past. However, will need to treat as TIA and perform workup for this. EKG with sinus tachycardia but no obvious ischemic changes. As aforementioned, MRI without acute stroke noted. Will obtain ECHO, carotid dopplers, lipid panel. Will also start him on aspirin monotherapy (ABCD2 score <4) for secondary prevention. Fortunately, his neurological exam is without any notable abnormalities. -Aspirin 325mg  once, then ASA 81mg  daily from tomorrow -f/u ECHO, carotid dopplers -f/u lipid panel, will likely need statin at discharge -f/u orthostatic vitals -A1c 7.3%, no prior history of diabetes -telemetry -carb-modified diet -PT eval -lovenox for dvt ppx -place in observation/telemetry   Type 2 NSTEMI -troponins elevated 32 > 48 -EKG with sinus tachycardia but no obvious ischemic changes -likely demand ischemia in the setting of TIA and sinus tachycardia -trend troponins until peak    Asymptomatic Bacteriuria -patient without any urinary complaints -UA with trace leukocytes, many bacteria, 11-20 WBCs -urine culture ordered by EDP -one dose of rocephin provided in ED, will not continue given lack of symptoms  Hypokalemia -Patient's K+ Level Trend: Recent Labs  Lab 06/11/23 1307 06/12/23 0057  K 3.5 3.4*  -Replete with *** -Continue to Monitor and Replete as Necessary -Repeat CMP  in the AM   New Onset Diabetes Mellitus Type 2 -Hemoglobin A1c was 7.3 -CBG Trend: Recent Labs  Lab 06/11/23 1237 06/12/23 0609  GLUCAP 119* 158*  -Patient with no prior history of T2DM. Blood glucose elevated at 113 on CMP today. -hgb A1c 7.3% -trend CBGs, goal 140-180 -consider adding SSI if CBGs above goal -will need oral regimen at discharge and appropriate outpatient follow up (reports he has PCP in [place])  Elevated D-Dimer -D-Dimer was 0.92 so CTA Chest PE Protocol was done and showed "No evidence of pulmonary embolism or other acute intrathoracic process."  HLD -Lipid panel was done and showed a total cholesterol/HDL ratio of 6.0, cholesterol of 187, HDL 31, LDL of 98, triglycerides of 289, VLDL 58  GERD/GI Prophylaxis  -continue protonix  Class I Obesity -Complicates overall prognosis and care -Estimated body mass index is 34.59 kg/m as calculated from the following:   Height as of this encounter: 6' (1.829 m).   Weight as of this encounter: 115.7 kg.  -Weight Loss and Dietary Counseling given

## 2023-08-16 ENCOUNTER — Ambulatory Visit (INDEPENDENT_AMBULATORY_CARE_PROVIDER_SITE_OTHER): Payer: No Typology Code available for payment source | Admitting: Neurology

## 2023-08-16 ENCOUNTER — Encounter: Payer: Self-pay | Admitting: Neurology

## 2023-08-16 VITALS — BP 131/92 | HR 77 | Ht 72.0 in | Wt 255.1 lb

## 2023-08-16 DIAGNOSIS — G459 Transient cerebral ischemic attack, unspecified: Secondary | ICD-10-CM

## 2023-08-16 DIAGNOSIS — E119 Type 2 diabetes mellitus without complications: Secondary | ICD-10-CM

## 2023-08-16 DIAGNOSIS — R200 Anesthesia of skin: Secondary | ICD-10-CM | POA: Diagnosis not present

## 2023-08-16 DIAGNOSIS — Z7984 Long term (current) use of oral hypoglycemic drugs: Secondary | ICD-10-CM | POA: Diagnosis not present

## 2023-08-16 NOTE — Progress Notes (Signed)
 [facility]  PATIENT: Steve Wolfe DOB: 02-19-1981  REQUESTING CLINICIAN: Eveline Hipps, DO HISTORY FROM: Patient  REASON FOR VISIT: Left sided weakness    HISTORICAL  CHIEF COMPLAINT:  Chief Complaint  Patient presents with   New Patient (Initial Visit)    Rm12, alone, NP/internal hospital referral for TIA:pt stated they have changed diet and started working out and feel much better    HISTORY OF PRESENT ILLNESS:  This is a 43 year old man past medical history hypertension, hyperlipidemia, diabetes, previous cerebellar stroke who is presenting after an episode of left-sided weakness.  Patient reports that he was sitting in his car talking to his wife, then he suddenly fell some numbness across the left side of his body, when he got up to walk to his house, he fell, he got up and fell again.  He was taken to the hospital.  His initial stroke workup was negative for any acute stroke, his symptoms improved and he was diagnosed with TIA and started on DAPT for 21 days.  He completed the regimen and currently on baby aspirin .  He tells me that he started atorvastatin  but it was having some side effect of anxiety and he discontinued. Patient also tells me that he discontinued his metformin , he reported he started working out, eating better and he feels much better.  Currently no additional complaints or concerns.    OTHER MEDICAL CONDITIONS: Hypertension, hyperlipidemia, diabetes, previous cerebellar stroke   REVIEW OF SYSTEMS: Full 14 system review of systems performed and negative with exception of: As noted in HPI  ALLERGIES: No Known Allergies  HOME MEDICATIONS: Outpatient Medications Prior to Visit  Medication Sig Dispense Refill   acetaminophen  (TYLENOL ) 325 MG tablet Take 2 tablets (650 mg total) by mouth every 6 (six) hours as needed for mild pain (pain score 1-3) (or Fever >/= 101). 20 tablet 0   aspirin  EC 81 MG tablet Take 1 tablet (81 mg total) by  mouth daily. Swallow whole. 30 tablet 12   Multiple Vitamin (MULTIVITAMIN WITH MINERALS) TABS tablet Take 1 tablet by mouth daily.     atorvastatin  (LIPITOR) 40 MG tablet Take 1 tablet (40 mg total) by mouth at bedtime. 30 tablet 0   cholecalciferol (VITAMIN D3) 25 MCG (1000 UNIT) tablet Take 1,000 Units by mouth daily.     clopidogrel  (PLAVIX ) 75 MG tablet Take 1 tablet (75 mg total) by mouth daily. 21 tablet 0   fenofibrate  54 MG tablet Take 1 tablet (54 mg total) by mouth daily. 30 tablet 0   metFORMIN  (GLUCOPHAGE ) 500 MG tablet Take 1 tablet (500 mg total) by mouth 2 (two) times daily with a meal. 60 tablet 0   No facility-administered medications prior to visit.    PAST MEDICAL HISTORY: Past Medical History:  Diagnosis Date   Stroke [facility])     PAST SURGICAL HISTORY: History reviewed. No pertinent surgical history.  FAMILY HISTORY: History reviewed. No pertinent family history.  SOCIAL HISTORY: Social History   Socioeconomic History   Marital status: Single    Spouse name: Not on file   Number of children: Not on file   Years of education: Not on file   Highest education level: Not on file  Occupational History   Not on file  Tobacco Use   Smoking status: Never   Smokeless tobacco: Never  Vaping Use   Vaping status: Never Used  Substance and Sexual Activity   Alcohol use: Yes    Comment: occ  Drug use: Not Currently   Sexual activity: Yes  Other Topics Concern   Not on file  Social History Narrative   Right handed   Social Drivers of Health   Financial Resource Strain: Not on file  Food Insecurity: No Food Insecurity (06/11/2023)   Hunger Vital Sign    Worried About Running Out of Food in the Last Year: Never true    Ran Out of Food in the Last Year: Never true  Transportation Needs: No Transportation Needs (06/11/2023)   PRAPARE - Administrator, Civil Service (Medical): No    Lack of Transportation (Non-Medical): No  Physical Activity: Not on  file  Stress: Not on file  Social Connections: Unknown (08/31/2021)   [facility]   Social Network    Social Network: Not on file  Intimate Partner Violence: Not At Risk (06/11/2023)   Humiliation, Afraid, Rape, and Kick questionnaire    Fear of Current or Ex-Partner: No    Emotionally Abused: No    Physically Abused: No    Sexually Abused: No    PHYSICAL EXAM  GENERAL EXAM/CONSTITUTIONAL: Vitals:  Vitals:   08/16/23 0818  BP: (!) 131/92  Pulse: 77  Weight: 255 lb 1.2 oz (115.7 kg)  Height: 6' (1.829 m)   Body mass index is 34.59 kg/m. Wt Readings from Last 3 Encounters:  08/16/23 255 lb 1.2 oz (115.7 kg)  06/12/23 255 lb 1.2 oz (115.7 kg)  03/13/23 261 lb (118.4 kg)   Patient is in no distress; well developed, nourished and groomed; neck is supple  MUSCULOSKELETAL: Gait, strength, tone, movements noted in Neurologic exam below  NEUROLOGIC: MENTAL STATUS:      No data to display         awake, alert, oriented to person, place and time recent and remote memory intact normal attention and concentration language fluent, comprehension intact, naming intact fund of knowledge appropriate  CRANIAL NERVE:  2nd, 3rd, 4th, 6th - Visual fields full to confrontation, extraocular muscles intact, no nystagmus 5th - facial sensation symmetric 7th - facial strength symmetric 8th - hearing intact 9th - palate elevates symmetrically, uvula midline 11th - shoulder shrug symmetric 12th - tongue protrusion midline  MOTOR:  normal bulk and tone, full strength in the BUE, BLE  SENSORY:  normal and symmetric to light touch  COORDINATION:  finger-nose-finger, fine finger movements normal  REFLEXES:  deep tendon reflexes present and symmetric  GAIT/STATION:  normal    DIAGNOSTIC DATA (LABS, IMAGING, TESTING) - I reviewed patient records, labs, notes, testing and imaging myself where available.  Lab Results  Component Value Date    WBC 6.0 06/12/2023   HGB 14.1 06/12/2023   HCT 40.5 06/12/2023   MCV 85.6 06/12/2023   PLT 241 06/12/2023      Component Value Date/Time   NA 138 06/12/2023 0057   K 3.4 (L) 06/12/2023 0057   CL 105 06/12/2023 0057   CO2 23 06/12/2023 0057   GLUCOSE 192 (H) 06/12/2023 0057   BUN 10 06/12/2023 0057   CREATININE 1.20 06/12/2023 0057   CALCIUM  9.0 06/12/2023 0057   PROT 7.2 06/11/2023 1307   ALBUMIN 4.3 06/11/2023 1307   AST 30 06/11/2023 1307   ALT 48 (H) 06/11/2023 1307   ALKPHOS 55 06/11/2023 1307   BILITOT 0.9 06/11/2023 1307   GFRNONAA >60 06/12/2023 0057   GFRAA >60 03/13/2018 0043   Lab Results  Component Value Date   CHOL 187 06/12/2023  HDL 31 (L) 06/12/2023   LDLCALC 98 06/12/2023   TRIG 289 (H) 06/12/2023   CHOLHDL 6.0 06/12/2023   Lab Results  Component Value Date   HGBA1C 7.3 (H) 06/11/2023   No results found for: "VITAMINB12" No results found for: "TSH"  MRI Brain 06/11/2023 1. No acute intracranial abnormality. 2. Similar left cerebellar encephalomalacia.   CTA Head and Neck 06/12/2023 1. Remote left cerebellar infarct. 2. No acute intracranial abnormality or significant interval change. 3. Mild tortuosity of the cervical ICA bilaterally. This is nonspecific, but can be seen in the setting of chronic hypertension. 4. Otherwise normal CTA of the neck. 5. Normal CTA Circle of Willis without significant proximal stenosis, aneurysm, or branch vessel occlusion.    ASSESSMENT AND PLAN  43 y.o. year old male with vascular risk factors including hypertension, hyperlipidemia, diabetes mellitus who is presenting after an episode of numbness and weakness, diagnosed with TIA.  He completed DAPT for 21 days and currently on aspirin  alone.  Plan will be for patient to continue with aspirin .  We also discussed restarting his medications, he agrees to restart metformin .  Advised him to continue following up with PCP and return as needed.  He voiced  understanding.   1. TIA (transient ischemic attack)   2. Numbness   3. Type 2 diabetes mellitus without complication, without long-term current use of insulin  [facility])      Patient Instructions  Continue current medications including aspirin  81 mg Please restart metformin  500 mg twice daily Continue follow-up PCP Return as needed.  No orders of the defined types were placed in this encounter.   No orders of the defined types were placed in this encounter.   Return if symptoms worsen or fail to improve.    Cassandra Cleveland, MD 08/16/2023, 8:46 AM  [facility] [address], [address] [place], [place] [address] [phone]

## 2023-08-16 NOTE — Patient Instructions (Addendum)
 Continue current medications including aspirin  81 mg Please restart metformin  500 mg twice daily Continue follow-up PCP Return as needed.

## 2023-11-13 ENCOUNTER — Encounter (HOSPITAL_BASED_OUTPATIENT_CLINIC_OR_DEPARTMENT_OTHER): Payer: Self-pay | Admitting: Emergency Medicine

## 2023-11-13 ENCOUNTER — Other Ambulatory Visit: Payer: Self-pay

## 2023-11-13 ENCOUNTER — Emergency Department (HOSPITAL_BASED_OUTPATIENT_CLINIC_OR_DEPARTMENT_OTHER): Payer: Self-pay

## 2023-11-13 DIAGNOSIS — Z7982 Long term (current) use of aspirin: Secondary | ICD-10-CM | POA: Insufficient documentation

## 2023-11-13 DIAGNOSIS — M778 Other enthesopathies, not elsewhere classified: Secondary | ICD-10-CM | POA: Insufficient documentation

## 2023-11-13 NOTE — ED Triage Notes (Signed)
 Pt reports RT wrist pain and edema around RT thumb, denies injury, trauma or known bite/sting  Has tried rest, ice and ibuprofen  with no relief

## 2023-11-14 ENCOUNTER — Emergency Department (HOSPITAL_BASED_OUTPATIENT_CLINIC_OR_DEPARTMENT_OTHER)
Admission: EM | Admit: 2023-11-14 | Discharge: 2023-11-14 | Disposition: A | Discharge status: Home / Self Care | Payer: Self-pay | Attending: Emergency Medicine | Admitting: Emergency Medicine

## 2023-11-14 DIAGNOSIS — M778 Other enthesopathies, not elsewhere classified: Secondary | ICD-10-CM

## 2023-11-14 MED ORDER — IBUPROFEN 800 MG PO TABS: 800.0000 mg | ORAL_TABLET | Freq: Four times a day (QID) | ORAL | 0 refills | Status: AC | PRN

## 2023-11-14 MED ORDER — DEXAMETHASONE SODIUM PHOSPHATE 10 MG/ML IJ SOLN
10.0000 mg | Freq: Once | INTRAMUSCULAR | Status: AC
  Administered 2023-11-14: 10 mg via INTRAMUSCULAR
  Filled 2023-11-14: qty 1

## 2023-11-14 NOTE — ED Provider Notes (Signed)
 [facility] Provider Note   CSN: [id number] Arrival date & time: 11/13/23  2306     Patient presents with: Hand Problem   Steve Wolfe is a 43 y.o. male.   Patient presents to the emergency department for evaluation of right wrist pain.  Patient reports that he has pain and swelling at the base of the right thumb, pain worsens with movement of the wrist.  Denies any direct injury.  Has been doing lifting at work.       Prior to Admission medications   Medication Sig Start Date End Date Taking? Authorizing Provider  ibuprofen  (ADVIL ) 800 MG tablet Take 1 tablet (800 mg total) by mouth every 6 (six) hours as needed for moderate pain (pain score 4-6). 11/14/23  Yes Cris Gibby, Lonni PARAS, MD  acetaminophen  (TYLENOL ) 325 MG tablet Take 2 tablets (650 mg total) by mouth every 6 (six) hours as needed for mild pain (pain score 1-3) (or Fever >/= 101). 06/12/23   Sheikh, Omair Latif, DO  aspirin  EC 81 MG tablet Take 1 tablet (81 mg total) by mouth daily. Swallow whole. 06/13/23   Sheikh, Omair Latif, DO  Multiple Vitamin (MULTIVITAMIN WITH MINERALS) TABS tablet Take 1 tablet by mouth daily.    [provider]    Allergies: Patient has no known allergies.    Review of Systems  Updated Vital Signs BP (!) 143/103 (BP Location: Left Arm)   Pulse 85   Temp 98.9 F (37.2 C) (Oral)   Resp 17   Ht 6' (1.829 m)   Wt 108.9 kg   SpO2 95%   BMI 32.55 kg/m   Physical Exam Vitals and nursing note reviewed.  Constitutional:      Appearance: Normal appearance.  HENT:     Head: Normocephalic.  Cardiovascular:     Pulses:          Radial pulses are 2+ on the right side.  Musculoskeletal:     Right wrist: Swelling and tenderness (volar wrist and base of thumb) present. No deformity, effusion or lacerations. Decreased range of motion (Decreased flexion and thumb opposition).  Skin:    General: Skin is cool.     Findings: No bruising or  erythema.  Neurological:     Mental Status: He is alert.     Cranial Nerves: Cranial nerves 2-12 are intact.     Sensory: Sensation is intact.     Motor: Motor function is intact.     (all labs ordered are listed, but only abnormal results are displayed) Labs Reviewed - No data to display  EKG: None  Radiology: DG Hand Complete Right Result Date: 11/13/2023 CLINICAL DATA:  Swelling, right hand pain EXAM: RIGHT HAND - COMPLETE 3+ VIEW COMPARISON:  None available FINDINGS: Oblique lucency through the midportion of the right 5th metacarpal concerning for nondisplaced fracture. No subluxation or dislocation. Joint spaces are maintained. Overlying soft tissue swelling. IMPRESSION: Concern for nondisplaced 5th metacarpal fracture. Overlying soft tissue swelling. Electronically Signed   By: Franky Crease M.D.   On: 11/13/2023 23:44     Procedures   Medications Ordered in the ED  dexamethasone  (DECADRON ) injection 10 mg (has no administration in time range)                                    Medical Decision Making Amount and/or Complexity of Data Reviewed Radiology: ordered.  Risk Prescription drug management.   Patient with atraumatic wrist pain.  Patient with tenderness over the volar tendons of the wrist.  He also has a very positive Finkelstein test.  Presentation consistent with tendinitis of the wrist and base of thumb.  X-ray of hand raise concern for fracture of the fifth metacarpal.  There is no tenderness in this area, patient denies injury.     Final diagnoses:  Wrist tendonitis    ED Discharge Orders          Ordered    ibuprofen  (ADVIL ) 800 MG tablet  Every 6 hours PRN        11/14/23 0027               Haze Lonni PARAS, MD 11/14/23 [id number]
